# Patient Record
Sex: Male | Born: 1950 | Race: White | Hispanic: No | Marital: Married | Smoking: Never smoker
Health system: Southern US, Community
[De-identification: ages and names within clinical notes are randomized; demographics above are authoritative.]

## PROBLEM LIST (undated history)

## (undated) DIAGNOSIS — L121 Cicatricial pemphigoid: Secondary | ICD-10-CM

## (undated) DIAGNOSIS — I10 Essential (primary) hypertension: Secondary | ICD-10-CM

## (undated) HISTORY — PX: OTHER SURGICAL HISTORY: SHX169

---

## 2015-09-24 ENCOUNTER — Emergency Department (HOSPITAL_COMMUNITY)
Admission: EM | Admit: 2015-09-24 | Discharge: 2015-09-24 | Disposition: A | Discharge status: Home / Self Care | Payer: BLUE CROSS/BLUE SHIELD | Attending: Emergency Medicine | Admitting: Emergency Medicine

## 2015-09-24 ENCOUNTER — Encounter (HOSPITAL_COMMUNITY): Payer: Self-pay

## 2015-09-24 DIAGNOSIS — Z79899 Other long term (current) drug therapy: Secondary | ICD-10-CM | POA: Diagnosis not present

## 2015-09-24 DIAGNOSIS — I10 Essential (primary) hypertension: Secondary | ICD-10-CM | POA: Diagnosis not present

## 2015-09-24 DIAGNOSIS — R040 Epistaxis: Secondary | ICD-10-CM | POA: Insufficient documentation

## 2015-09-24 DIAGNOSIS — Z7982 Long term (current) use of aspirin: Secondary | ICD-10-CM | POA: Diagnosis not present

## 2015-09-24 HISTORY — DX: Essential (primary) hypertension: I10

## 2015-09-24 MED ORDER — TRANEXAMIC ACID 1000 MG/10ML IV SOLN
500.0000 mg | Freq: Once | INTRAVENOUS | Status: DC
  Filled 2015-09-24: qty 10

## 2015-09-24 MED ORDER — AMOXICILLIN-POT CLAVULANATE 500-125 MG PO TABS: 1.0000 | ORAL_TABLET | Freq: Three times a day (TID) | ORAL | 0 refills | Status: DC

## 2015-09-24 MED ORDER — ONDANSETRON 4 MG PO TBDP
4.0000 mg | ORAL_TABLET | Freq: Once | ORAL | Status: DC
  Filled 2015-09-24: qty 1

## 2015-09-24 MED ORDER — LIDOCAINE VISCOUS 2 % MT SOLN
15.0000 mL | Freq: Once | OROMUCOSAL | Status: DC
Start: 2015-09-24 — End: 2015-09-25
  Filled 2015-09-24: qty 15

## 2015-09-24 NOTE — ED Notes (Signed)
PA at bedside with patient

## 2015-09-24 NOTE — ED Provider Notes (Signed)
[facility] DEPT Provider Note   CSN: [id number] Arrival date & time: 09/24/15  1726     History   Chief Complaint Chief Complaint  Patient presents with  . Epistaxis    HPI Henry Ganderarl Fisher is a 65 y.o. male.  HPI   Patient has a PMH of adenoidectomy and nosebleeds as a young kid. He has not had any bleeds in his adult life.  He has a PMH of hypertension. He takes a daily baby aspirin. He reports nose bleed starting at 8 am this morning and has been continually bleeding. [facility] and sent to the ED for further management and treatment. He denies hitting his nose or having an injury. He denies experiencing any other areas of abnormal bleeding. The bleeding is coming through the right nare only. He has not been coughing or vomiting blood. No fevers, N/V/D, weakness, confusion or headaches. He otherwise reports feeling fine.    Past Medical History:  Diagnosis Date  . Hypertension     There are no active problems to display for this patient.  Past Surgical History:  Procedure Laterality Date  . kidney removed       Home Medications    Prior to Admission medications   Medication Sig Start Date End Date Taking? Authorizing Provider  aliskiren (TEKTURNA) 150 MG tablet Take 75 mg by mouth every evening.   Yes Historical Provider, MD  aspirin EC 81 MG tablet Take 81 mg by mouth every evening.   Yes Historical Provider, MD  chlorhexidine (PERIDEX) 0.12 % solution Use as directed 15 mLs in the mouth or throat 2 (two) times daily as needed (mouth leasions).   Yes Historical Provider, MD  dexamethasone (DECADRON) 0.5 MG/5ML solution Take 0.5 mg by mouth daily as needed (mouth leasions).   Yes Historical Provider, MD  amoxicillin-clavulanate (AUGMENTIN) 500-125 MG tablet Take 1 tablet (500 mg total) by mouth every 8 (eight) hours. 09/24/15   Marlon Peliffany Elzena Muston, PA-C    Family History History reviewed. No pertinent family history.  Social History Social History    Substance Use Topics  . Smoking status: Never Smoker  . Smokeless tobacco: Never Used  . Alcohol use Yes    Allergies   Aspirin  Review of Systems Review of Systems Review of Systems All other systems negative except as documented in the HPI. All pertinent positives and negatives as reviewed in the HPI.   Physical Exam Updated Vital Signs BP (!) 162/107   Pulse 76   Temp 97.5 F (36.4 C) (Oral)   Resp 14   SpO2 96%   Physical Exam  Constitutional: He is oriented to person, place, and time. He appears well-developed and well-nourished.  HENT:  Head: Normocephalic and atraumatic.  Nose: Epistaxis (right nare; unable to visualize specific siite of bleeding due to amount of blood) is observed.  Mouth/Throat: Mucous membranes are normal.  No blood in left nare  Eyes: EOM are normal. Pupils are equal, round, and reactive to light.  Neck: Normal range of motion.  Cardiovascular: Normal rate and regular rhythm.   Pulmonary/Chest: Effort normal and breath sounds normal.  Musculoskeletal: Normal range of motion.  Neurological: He is alert and oriented to person, place, and time.  Skin: Skin is warm and dry.     ED Treatments / Results  Labs (all labs ordered are listed, but only abnormal results are displayed) Labs Reviewed - No data to display  EKG  EKG Interpretation None  Radiology No results found.  Procedures Procedures (including critical care time)  Medications Ordered in ED Medications  lidocaine (XYLOCAINE) 2 % viscous mouth solution 15 mL (not administered)  tranexamic acid (CYKLOKAPRON) injection 500 mg (not administered)  ondansetron (ZOFRAN-ODT) disintegrating tablet 4 mg (not administered)     Initial Impression / Assessment and Plan / ED Course  I have reviewed the triage vital signs and the nursing notes.  Pertinent labs & imaging results that were available during my care of the patient were reviewed by me and considered in my medical  decision making (see chart for details).  Clinical Course    Patient is 65 yo, discussed case with Dr. Jacqulyn BathLong who is going to see patient and attempt to cauterize area if he is able to visualize source of bleeding. If unsuccessful we will need to place a [place].   -- We tried TXA first, this was unsuccessful there for packing was placed. Patient started on abx per attending, referral to ENT- pt is to call first thing in the morning to arrange f/u    Final Clinical Impressions(s) / ED Diagnoses   Final diagnoses:  Epistaxis    New Prescriptions New Prescriptions   AMOXICILLIN-CLAVULANATE (AUGMENTIN) 500-125 MG TABLET    Take 1 tablet (500 mg total) by mouth every 8 (eight) hours.     Marlon Peliffany Lissandro Dilorenzo, PA-C 09/24/15 2324    Maia PlanJoshua G Long, MD 09/25/15 [phone]

## 2015-09-24 NOTE — ED Notes (Signed)
Pt d/c home with family via w/c 

## 2015-09-24 NOTE — ED Triage Notes (Signed)
Pt states nose bleed ongoing since this morning. [facility] sent here for continued bleeding. Pt states passing of clots. Pt states hx of similar issues, but not in many years. Pt states on 81mg  ASA daily.

## 2015-10-25 ENCOUNTER — Encounter: Payer: Self-pay | Admitting: Nurse Practitioner

## 2015-10-25 ENCOUNTER — Ambulatory Visit (INDEPENDENT_AMBULATORY_CARE_PROVIDER_SITE_OTHER): Payer: BLUE CROSS/BLUE SHIELD | Admitting: Nurse Practitioner

## 2015-10-25 VITALS — BP 162/102 | HR 88 | Temp 98.4°F | Wt 229.0 lb

## 2015-10-25 DIAGNOSIS — Z0000 Encounter for general adult medical examination without abnormal findings: Secondary | ICD-10-CM

## 2015-10-25 DIAGNOSIS — Z23 Encounter for immunization: Secondary | ICD-10-CM | POA: Diagnosis not present

## 2015-10-25 DIAGNOSIS — I10 Essential (primary) hypertension: Secondary | ICD-10-CM | POA: Diagnosis not present

## 2015-10-25 DIAGNOSIS — Z905 Acquired absence of kidney: Secondary | ICD-10-CM | POA: Insufficient documentation

## 2015-10-25 MED ORDER — ALISKIREN FUMARATE 150 MG PO TABS: 150.0000 mg | ORAL_TABLET | Freq: Every evening | ORAL | 2 refills | Status: DC

## 2015-10-25 MED ORDER — ALISKIREN FUMARATE 150 MG PO TABS: 75.0000 mg | ORAL_TABLET | Freq: Every evening | ORAL | 2 refills | Status: DC

## 2015-10-25 NOTE — Progress Notes (Signed)
Subjective:    Patient ID: Henry Fisher, male    DOB: [birth year], 65 y.o.   MRN: [id number]  Patient presents today for complete physical or establish care (new patient) and HTN  Hypertension  Pertinent negatives include no chest pain, headaches, malaise/fatigue, palpitations or shortness of breath. Risk factors for coronary artery disease include obesity and male gender. Treatments tried: [place]. The current treatment provides significant improvement. There are no compliance problems.     HTN: Reports normal BP reading s at home but unable to tell me exact numbers.  Immunizations: (TDAP, Hep C screen, Pneumovax, Influenza, zoster)  Health Maintenance  Topic Date Due  .  Hepatitis C: One time screening is recommended by Center for Disease Control  (CDC) for  adults born from [birth year] through 1967.   06-06-1950  . Tetanus Vaccine  04/19/1967  . Colon Cancer Screening  04/19/1998  . Shingles Vaccine  04/18/2008  . Pneumonia vaccines (1 of 2 - PCV13) 04/18/2013  . Flu Shot  09/04/2015   Diet:healthy Weight:  Wt Readings from Last 3 Encounters:  10/25/15 229 lb (103.9 kg)    Exercise:bicycling for 1hr daily. Fall Risk: Fall Risk  10/25/2015  Falls in the past year? No   Depression/Suicide: Depression screen Henry Fisher 2/9 10/25/2015  Decreased Interest 0  Down, Depressed, Hopeless 0  PHQ - 2 Score 0   No flowsheet data found. Colonoscopy (every 5-69yrs, >50-29yrs):Up-to-date per patient, blood clots requested from patient. PSA (yearly, >66yrs): Needed Vision: Up-to-date per patient Dental: Up-to-date per patient Advanced Directive: Advanced Directives 09/24/2015  Does patient have an advance directive? Yes  Type of Advance Directive Living will  Does patient want to make changes to advanced directive? No - Patient declined  Copy of advanced directive(s) in chart? No - copy requested   Sexual History (birth control, marital status, STD): Divorced, not sexually active, has 1  son. To [place] from [place], 10 months ago.   Medications and allergies reviewed with patient and updated if appropriate.  Patient Active Problem List   Diagnosis Date Noted  . Essential hypertension, benign 10/25/2015  . 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 aspirin EC 81 MG tablet Take 81 mg by mouth every evening.     No current facility-administered medications on file prior to visit.     Past Medical History:  Diagnosis Date  . Hypertension     Past Surgical History:  Procedure Laterality Date  . kidney removed    . left nephrectomy     at age 69    Social History   Social History  . Marital status: Married    Spouse name: N/A  . Number of children: N/A  . Years of education: N/A   Social History Main Topics  . Smoking status: Never Smoker  . Smokeless tobacco: Never Used  . Alcohol use Yes  . Drug use: No  . Sexual activity: Not Asked   Other Topics Concern  . None   Social History Narrative  . None    Family History  Problem Relation Age of Onset  . Stroke Father         Review of Systems  Constitutional: Negative for fever, malaise/fatigue and weight loss.  HENT: Negative for congestion and sore throat.   Eyes:       Negative for visual changes  Respiratory: Negative for cough and shortness of breath.  Cardiovascular: Negative for chest pain, palpitations and leg swelling.  Gastrointestinal: Negative for blood in stool, constipation, diarrhea and heartburn.  Genitourinary: Negative for dysuria, frequency and urgency.  Musculoskeletal: Negative for falls, joint pain and myalgias.  Skin: Negative for rash.  Neurological: Negative for dizziness, sensory change and headaches.  Endo/Heme/Allergies: Does not bruise/bleed easily.  Psychiatric/Behavioral: Negative for depression, substance abuse and suicidal ideas. The patient is not  nervous/anxious.     Objective:   Vitals:   10/25/15 1402  BP: (!) 162/102  Pulse: 88  Temp: 98.4 F (36.9 C)    There is no height or weight on file to calculate BMI.   Physical Examination:  Physical Exam  Constitutional: He is oriented to person, place, and time and well-developed, well-nourished, and in no distress. No distress.  HENT:  Right Ear: External ear normal.  Left Ear: External ear normal.  Nose: Nose normal.  Mouth/Throat: Oropharynx is clear and moist. No oropharyngeal exudate.  Eyes: Conjunctivae and EOM are normal. Pupils are equal, round, and reactive to light. No scleral icterus.  Neck: Normal range of motion. Neck supple. No thyromegaly present.  Cardiovascular: Normal rate, regular rhythm, normal heart sounds and intact distal pulses.   Pulmonary/Chest: Effort normal and breath sounds normal.  Abdominal: Soft. Bowel sounds are normal. He exhibits no distension. There is no tenderness.  Genitourinary: Rectum normal.  Musculoskeletal: Normal range of motion. He exhibits no edema or tenderness.  Lymphadenopathy:    He has no cervical adenopathy.  Neurological: He is alert and oriented to person, place, and time. He has normal reflexes. No cranial nerve deficit. Gait normal.  Skin: Skin is warm and dry.  Psychiatric: Affect and judgment normal.  Vitals reviewed.   ASSESSMENT and PLAN:  Henry Fisher was seen today for hypertension.  Diagnoses and all orders for this visit:  Encounter for preventive health examination -     Comprehensive metabolic panel; Future -     TSH; Future -     Lipid Profile; Future -     CBC w/Diff; Future -     PSA; Future  Essential hypertension, benign -     Comprehensive metabolic panel; Future -     Discontinue: aliskiren (TEKTURNA) 150 MG tablet; Take 0.5 tablets (75 mg total) by mouth every evening. -     aliskiren (TEKTURNA) 150 MG tablet; Take 1 tablet (150 mg total) by mouth every evening.  Need for prophylactic  vaccination and inoculation against influenza -     Flu vaccine HIGH DOSE PF    No problem-specific Assessment & Plan notes found for this encounter.     Follow up: Return in about 3 months (around 01/24/2016).  Alysia Pennaharlotte Tannor Pyon, NP

## 2015-10-25 NOTE — Progress Notes (Signed)
Pre visit review using our clinic review tool, if applicable. No additional management support is needed unless otherwise documented below in the visit note. 

## 2015-10-29 ENCOUNTER — Telehealth: Payer: Self-pay | Admitting: Emergency Medicine

## 2015-10-29 NOTE — Telephone Encounter (Signed)
[employer] called and is still waiting on a PA or the prescription aliskiren (TEKTURNA) 150 MG tablet. Please fax it to [phone]. Thanks.

## 2015-11-09 NOTE — Telephone Encounter (Signed)
Per CoverMyMeds - [employer], medication is not covered. Pt must first try and fail 3 of the following; Lisinopril, Losartan, Valsartan, Irbesartan, and Enalapril. Please advise. Thanks!

## 2015-11-09 NOTE — Telephone Encounter (Signed)
I have not received any paperwork for prior authorization on this patient. He is a new patient to me and presented with no medical records. I advised him to get medical records as soon as possible and lab work, but patient has not done so until today. Please reach out to patient and inquire about his medical records. Also advised him to return to the lab in the basement for lab draw as ordered. I am unable to do a prior authorization at this time due to lack of medical information on this patient. Thank you

## 2015-11-12 NOTE — Telephone Encounter (Signed)
Contacted patient to advised of Charlotte's recommendation. Pt was upset that PA was not submitted to [place] in [place].  I attempted to inform patient that PA are submitted to insurance company and not previous/original prescriber but pt became irate and stated that it was "not worth the trouble" and he was "not coming back to this office anyway" and ended the phone call.

## 2016-01-21 ENCOUNTER — Ambulatory Visit: Payer: Medicare Other | Admitting: Nurse Practitioner

## 2016-05-21 ENCOUNTER — Other Ambulatory Visit: Payer: Self-pay | Admitting: Internal Medicine

## 2016-05-21 DIAGNOSIS — I10 Essential (primary) hypertension: Secondary | ICD-10-CM

## 2016-11-16 ENCOUNTER — Other Ambulatory Visit: Payer: Self-pay | Admitting: Internal Medicine

## 2016-11-16 DIAGNOSIS — I10 Essential (primary) hypertension: Secondary | ICD-10-CM

## 2016-11-17 NOTE — Telephone Encounter (Signed)
Please help call pt and offer an appt. Pt need to come in to see charlotte before we send this med.   Thanks.

## 2016-11-17 NOTE — Telephone Encounter (Signed)
LM letting pt know °

## 2017-05-27 DIAGNOSIS — M5417 Radiculopathy, lumbosacral region: Secondary | ICD-10-CM | POA: Diagnosis not present

## 2017-05-27 DIAGNOSIS — M545 Low back pain: Secondary | ICD-10-CM | POA: Diagnosis not present

## 2017-05-27 DIAGNOSIS — M48061 Spinal stenosis, lumbar region without neurogenic claudication: Secondary | ICD-10-CM | POA: Diagnosis not present

## 2017-06-09 DIAGNOSIS — J0140 Acute pansinusitis, unspecified: Secondary | ICD-10-CM | POA: Diagnosis not present

## 2017-06-09 DIAGNOSIS — J309 Allergic rhinitis, unspecified: Secondary | ICD-10-CM | POA: Diagnosis not present

## 2018-01-08 DIAGNOSIS — M1310 Monoarthritis, not elsewhere classified, unspecified site: Secondary | ICD-10-CM | POA: Diagnosis not present

## 2018-06-30 DIAGNOSIS — I10 Essential (primary) hypertension: Secondary | ICD-10-CM | POA: Diagnosis not present

## 2018-07-13 DIAGNOSIS — Z0000 Encounter for general adult medical examination without abnormal findings: Secondary | ICD-10-CM | POA: Diagnosis not present

## 2018-07-13 DIAGNOSIS — E559 Vitamin D deficiency, unspecified: Secondary | ICD-10-CM | POA: Diagnosis not present

## 2018-07-13 DIAGNOSIS — I10 Essential (primary) hypertension: Secondary | ICD-10-CM | POA: Diagnosis not present

## 2018-07-13 DIAGNOSIS — Z125 Encounter for screening for malignant neoplasm of prostate: Secondary | ICD-10-CM | POA: Diagnosis not present

## 2018-07-13 DIAGNOSIS — R5383 Other fatigue: Secondary | ICD-10-CM | POA: Diagnosis not present

## 2018-07-13 DIAGNOSIS — M79672 Pain in left foot: Secondary | ICD-10-CM | POA: Diagnosis not present

## 2018-07-13 DIAGNOSIS — R0602 Shortness of breath: Secondary | ICD-10-CM | POA: Diagnosis not present

## 2018-07-20 DIAGNOSIS — E559 Vitamin D deficiency, unspecified: Secondary | ICD-10-CM | POA: Diagnosis not present

## 2018-07-20 DIAGNOSIS — M79672 Pain in left foot: Secondary | ICD-10-CM | POA: Diagnosis not present

## 2018-07-20 DIAGNOSIS — E789 Disorder of lipoprotein metabolism, unspecified: Secondary | ICD-10-CM | POA: Diagnosis not present

## 2018-07-20 DIAGNOSIS — Z1339 Encounter for screening examination for other mental health and behavioral disorders: Secondary | ICD-10-CM | POA: Diagnosis not present

## 2018-07-20 DIAGNOSIS — I10 Essential (primary) hypertension: Secondary | ICD-10-CM | POA: Diagnosis not present

## 2018-07-23 DIAGNOSIS — N183 Chronic kidney disease, stage 3 (moderate): Secondary | ICD-10-CM | POA: Diagnosis not present

## 2018-07-28 DIAGNOSIS — H903 Sensorineural hearing loss, bilateral: Secondary | ICD-10-CM | POA: Diagnosis not present

## 2018-07-28 DIAGNOSIS — H6121 Impacted cerumen, right ear: Secondary | ICD-10-CM | POA: Diagnosis not present

## 2018-08-19 DIAGNOSIS — E559 Vitamin D deficiency, unspecified: Secondary | ICD-10-CM | POA: Diagnosis not present

## 2018-08-19 DIAGNOSIS — E789 Disorder of lipoprotein metabolism, unspecified: Secondary | ICD-10-CM | POA: Diagnosis not present

## 2018-08-19 DIAGNOSIS — Z1159 Encounter for screening for other viral diseases: Secondary | ICD-10-CM | POA: Diagnosis not present

## 2018-08-19 DIAGNOSIS — I10 Essential (primary) hypertension: Secondary | ICD-10-CM | POA: Diagnosis not present

## 2018-09-06 ENCOUNTER — Other Ambulatory Visit: Payer: Self-pay

## 2019-02-15 DIAGNOSIS — E789 Disorder of lipoprotein metabolism, unspecified: Secondary | ICD-10-CM | POA: Diagnosis not present

## 2019-02-15 DIAGNOSIS — Z1331 Encounter for screening for depression: Secondary | ICD-10-CM | POA: Diagnosis not present

## 2019-02-15 DIAGNOSIS — Z1339 Encounter for screening examination for other mental health and behavioral disorders: Secondary | ICD-10-CM | POA: Diagnosis not present

## 2019-02-15 DIAGNOSIS — E559 Vitamin D deficiency, unspecified: Secondary | ICD-10-CM | POA: Diagnosis not present

## 2019-02-15 DIAGNOSIS — Z1159 Encounter for screening for other viral diseases: Secondary | ICD-10-CM | POA: Diagnosis not present

## 2019-02-15 DIAGNOSIS — I10 Essential (primary) hypertension: Secondary | ICD-10-CM | POA: Diagnosis not present

## 2019-02-15 DIAGNOSIS — H9193 Unspecified hearing loss, bilateral: Secondary | ICD-10-CM | POA: Diagnosis not present

## 2019-03-06 ENCOUNTER — Ambulatory Visit: Payer: Medicare Other

## 2019-03-13 ENCOUNTER — Ambulatory Visit: Payer: Medicare Other

## 2019-03-27 ENCOUNTER — Ambulatory Visit: Payer: Medicare Other

## 2019-05-16 DIAGNOSIS — E789 Disorder of lipoprotein metabolism, unspecified: Secondary | ICD-10-CM | POA: Diagnosis not present

## 2019-05-16 DIAGNOSIS — I10 Essential (primary) hypertension: Secondary | ICD-10-CM | POA: Diagnosis not present

## 2019-05-16 DIAGNOSIS — Z1159 Encounter for screening for other viral diseases: Secondary | ICD-10-CM | POA: Diagnosis not present

## 2019-05-16 DIAGNOSIS — E559 Vitamin D deficiency, unspecified: Secondary | ICD-10-CM | POA: Diagnosis not present

## 2020-09-13 ENCOUNTER — Inpatient Hospital Stay (HOSPITAL_BASED_OUTPATIENT_CLINIC_OR_DEPARTMENT_OTHER)
Admission: EM | Admit: 2020-09-13 | Discharge: 2020-09-15 | DRG: 061 | Disposition: A | Discharge status: Home / Self Care | Payer: PPO | Attending: Internal Medicine | Admitting: Internal Medicine

## 2020-09-13 ENCOUNTER — Inpatient Hospital Stay (HOSPITAL_COMMUNITY): Payer: PPO

## 2020-09-13 ENCOUNTER — Encounter (HOSPITAL_BASED_OUTPATIENT_CLINIC_OR_DEPARTMENT_OTHER): Payer: Self-pay | Admitting: Emergency Medicine

## 2020-09-13 ENCOUNTER — Emergency Department (HOSPITAL_BASED_OUTPATIENT_CLINIC_OR_DEPARTMENT_OTHER): Payer: PPO

## 2020-09-13 ENCOUNTER — Other Ambulatory Visit: Payer: Self-pay

## 2020-09-13 DIAGNOSIS — E7800 Pure hypercholesterolemia, unspecified: Secondary | ICD-10-CM | POA: Diagnosis not present

## 2020-09-13 DIAGNOSIS — I6522 Occlusion and stenosis of left carotid artery: Secondary | ICD-10-CM | POA: Diagnosis not present

## 2020-09-13 DIAGNOSIS — I6381 Other cerebral infarction due to occlusion or stenosis of small artery: Principal | ICD-10-CM | POA: Diagnosis present

## 2020-09-13 DIAGNOSIS — I619 Nontraumatic intracerebral hemorrhage, unspecified: Secondary | ICD-10-CM | POA: Diagnosis not present

## 2020-09-13 DIAGNOSIS — G319 Degenerative disease of nervous system, unspecified: Secondary | ICD-10-CM | POA: Diagnosis not present

## 2020-09-13 DIAGNOSIS — R519 Headache, unspecified: Secondary | ICD-10-CM | POA: Diagnosis not present

## 2020-09-13 DIAGNOSIS — Z7982 Long term (current) use of aspirin: Secondary | ICD-10-CM | POA: Diagnosis not present

## 2020-09-13 DIAGNOSIS — I63532 Cerebral infarction due to unspecified occlusion or stenosis of left posterior cerebral artery: Secondary | ICD-10-CM | POA: Diagnosis not present

## 2020-09-13 DIAGNOSIS — Z823 Family history of stroke: Secondary | ICD-10-CM | POA: Diagnosis not present

## 2020-09-13 DIAGNOSIS — R29702 NIHSS score 2: Secondary | ICD-10-CM | POA: Diagnosis not present

## 2020-09-13 DIAGNOSIS — I639 Cerebral infarction, unspecified: Secondary | ICD-10-CM | POA: Diagnosis not present

## 2020-09-13 DIAGNOSIS — R479 Unspecified speech disturbances: Secondary | ICD-10-CM | POA: Diagnosis not present

## 2020-09-13 DIAGNOSIS — I6389 Other cerebral infarction: Secondary | ICD-10-CM | POA: Diagnosis not present

## 2020-09-13 DIAGNOSIS — R29700 NIHSS score 0: Secondary | ICD-10-CM | POA: Diagnosis not present

## 2020-09-13 DIAGNOSIS — R29704 NIHSS score 4: Secondary | ICD-10-CM | POA: Diagnosis not present

## 2020-09-13 DIAGNOSIS — Z20822 Contact with and (suspected) exposure to covid-19: Secondary | ICD-10-CM | POA: Diagnosis not present

## 2020-09-13 DIAGNOSIS — R4701 Aphasia: Secondary | ICD-10-CM | POA: Diagnosis present

## 2020-09-13 DIAGNOSIS — R29818 Other symptoms and signs involving the nervous system: Secondary | ICD-10-CM | POA: Diagnosis not present

## 2020-09-13 DIAGNOSIS — Z79899 Other long term (current) drug therapy: Secondary | ICD-10-CM

## 2020-09-13 DIAGNOSIS — R29701 NIHSS score 1: Secondary | ICD-10-CM | POA: Diagnosis not present

## 2020-09-13 DIAGNOSIS — I10 Essential (primary) hypertension: Secondary | ICD-10-CM | POA: Diagnosis present

## 2020-09-13 DIAGNOSIS — I6602 Occlusion and stenosis of left middle cerebral artery: Secondary | ICD-10-CM | POA: Diagnosis not present

## 2020-09-13 DIAGNOSIS — R29703 NIHSS score 3: Secondary | ICD-10-CM | POA: Diagnosis present

## 2020-09-13 DIAGNOSIS — Z905 Acquired absence of kidney: Secondary | ICD-10-CM

## 2020-09-13 HISTORY — DX: Cicatricial pemphigoid: L12.1

## 2020-09-13 LAB — CBC
HCT: 50.6 % (ref 39.0–52.0)
Hemoglobin: 17.5 g/dL — ABNORMAL HIGH (ref 13.0–17.0)
MCH: 31.1 pg (ref 26.0–34.0)
MCHC: 34.6 g/dL (ref 30.0–36.0)
MCV: 89.9 fL (ref 80.0–100.0)
Platelets: 264 10*3/uL (ref 150–400)
RBC: 5.63 MIL/uL (ref 4.22–5.81)
RDW: 11.7 % (ref 11.5–15.5)
WBC: 7.9 10*3/uL (ref 4.0–10.5)
nRBC: 0 % (ref 0.0–0.2)

## 2020-09-13 LAB — DIFFERENTIAL
Abs Immature Granulocytes: 0.02 10*3/uL (ref 0.00–0.07)
Basophils Absolute: 0.1 10*3/uL (ref 0.0–0.1)
Basophils Relative: 1 %
Eosinophils Absolute: 0.3 10*3/uL (ref 0.0–0.5)
Eosinophils Relative: 4 %
Immature Granulocytes: 0 %
Lymphocytes Relative: 39 %
Lymphs Abs: 3.1 10*3/uL (ref 0.7–4.0)
Monocytes Absolute: 0.7 10*3/uL (ref 0.1–1.0)
Monocytes Relative: 9 %
Neutro Abs: 3.7 10*3/uL (ref 1.7–7.7)
Neutrophils Relative %: 47 %

## 2020-09-13 LAB — COMPREHENSIVE METABOLIC PANEL
ALT: 32 U/L (ref 0–44)
AST: 28 U/L (ref 15–41)
Albumin: 4.5 g/dL (ref 3.5–5.0)
Alkaline Phosphatase: 64 U/L (ref 38–126)
Anion gap: 9 (ref 5–15)
BUN: 14 mg/dL (ref 8–23)
CO2: 27 mmol/L (ref 22–32)
Calcium: 10.4 mg/dL — ABNORMAL HIGH (ref 8.9–10.3)
Chloride: 100 mmol/L (ref 98–111)
Creatinine, Ser: 1.02 mg/dL (ref 0.61–1.24)
GFR, Estimated: >60 mL/min (ref >60)
Glucose, Bld: 126 mg/dL — ABNORMAL HIGH (ref 70–99)
Potassium: 4.1 mmol/L (ref 3.5–5.1)
Sodium: 136 mmol/L (ref 135–145)
Total Bilirubin: 0.7 mg/dL (ref 0.3–1.2)
Total Protein: 8 g/dL (ref 6.5–8.1)

## 2020-09-13 LAB — URINALYSIS, ROUTINE W REFLEX MICROSCOPIC
Bilirubin Urine: NEGATIVE
Glucose, UA: NEGATIVE mg/dL
Hgb urine dipstick: NEGATIVE
Ketones, ur: NEGATIVE mg/dL
Leukocytes,Ua: NEGATIVE
Nitrite: NEGATIVE
Protein, ur: NEGATIVE mg/dL
Specific Gravity, Urine: 1.022 (ref 1.005–1.030)
pH: 7 (ref 5.0–8.0)

## 2020-09-13 LAB — RAPID URINE DRUG SCREEN, HOSP PERFORMED
Amphetamines: NOT DETECTED
Barbiturates: NOT DETECTED
Benzodiazepines: NOT DETECTED
Cocaine: NOT DETECTED
Opiates: NOT DETECTED
Tetrahydrocannabinol: POSITIVE — AB

## 2020-09-13 LAB — RESP PANEL BY RT-PCR (FLU A&B, COVID) ARPGX2
Influenza A by PCR: NEGATIVE
Influenza B by PCR: NEGATIVE
SARS Coronavirus 2 by RT PCR: NEGATIVE

## 2020-09-13 LAB — MRSA NEXT GEN BY PCR, NASAL: MRSA by PCR Next Gen: NOT DETECTED

## 2020-09-13 LAB — ETHANOL: Alcohol, Ethyl (B): 11 mg/dL — ABNORMAL HIGH (ref <10)

## 2020-09-13 LAB — APTT: aPTT: 27 seconds (ref 24–36)

## 2020-09-13 LAB — CBG MONITORING, ED: Glucose-Capillary: 137 mg/dL — ABNORMAL HIGH (ref 70–99)

## 2020-09-13 LAB — PROTIME-INR
INR: 1 (ref 0.8–1.2)
Prothrombin Time: 12.8 seconds (ref 11.4–15.2)

## 2020-09-13 MED ORDER — PANTOPRAZOLE SODIUM 40 MG IV SOLR
40.0000 mg | Freq: Every day | INTRAVENOUS | Status: DC
  Administered 2020-09-13: 40 mg via INTRAVENOUS
  Filled 2020-09-13: qty 40

## 2020-09-13 MED ORDER — ACETAMINOPHEN 650 MG RE SUPP: 650.0000 mg | RECTAL | Status: DC | PRN

## 2020-09-13 MED ORDER — ACETAMINOPHEN 325 MG PO TABS: 650.0000 mg | ORAL_TABLET | ORAL | Status: DC | PRN

## 2020-09-13 MED ORDER — ACETAMINOPHEN 160 MG/5ML PO SOLN: 650.0000 mg | ORAL | Status: DC | PRN

## 2020-09-13 MED ORDER — SODIUM CHLORIDE 0.9 % IV SOLN
50.0000 mL | Freq: Once | INTRAVENOUS | Status: AC
  Administered 2020-09-13: 50 mL via INTRAVENOUS

## 2020-09-13 MED ORDER — ONDANSETRON HCL 4 MG/2ML IJ SOLN: 4.0000 mg | Freq: Once | INTRAMUSCULAR | Status: AC

## 2020-09-13 MED ORDER — ALTEPLASE (STROKE) FULL DOSE INFUSION
0.9000 mg/kg | Freq: Once | INTRAVENOUS | Status: AC
  Filled 2020-09-13: qty 100

## 2020-09-13 MED ORDER — CHLORHEXIDINE GLUCONATE CLOTH 2 % EX PADS
6.0000 | MEDICATED_PAD | Freq: Every day | CUTANEOUS | Status: DC
  Administered 2020-09-15: 6 via TOPICAL

## 2020-09-13 MED ORDER — STROKE: EARLY STAGES OF RECOVERY BOOK: Freq: Once | Status: AC

## 2020-09-13 MED ORDER — ONDANSETRON HCL 4 MG/2ML IJ SOLN
4.0000 mg | Freq: Once | INTRAMUSCULAR | Status: AC
  Administered 2020-09-13: 4 mg via INTRAVENOUS
  Filled 2020-09-13: qty 2

## 2020-09-13 MED ORDER — CLEVIDIPINE BUTYRATE 0.5 MG/ML IV EMUL
0.0000 mg/h | INTRAVENOUS | Status: DC
  Administered 2020-09-13: 1 mg/h via INTRAVENOUS
  Filled 2020-09-13: qty 100

## 2020-09-13 MED ORDER — SENNOSIDES-DOCUSATE SODIUM 8.6-50 MG PO TABS: 1.0000 | ORAL_TABLET | Freq: Every evening | ORAL | Status: DC | PRN

## 2020-09-13 MED ORDER — ALTEPLASE 100 MG IV SOLR
INTRAVENOUS | Status: AC
  Administered 2020-09-13: 88.4 mg via INTRAVENOUS
  Filled 2020-09-13: qty 100

## 2020-09-13 MED ORDER — LABETALOL HCL 5 MG/ML IV SOLN
INTRAVENOUS | Status: AC
  Administered 2020-09-13: 10 mg via INTRAVENOUS
  Filled 2020-09-13: qty 4

## 2020-09-13 MED ORDER — SODIUM CHLORIDE 0.9 % IV SOLN: INTRAVENOUS | Status: DC

## 2020-09-13 MED ORDER — ONDANSETRON HCL 4 MG/2ML IJ SOLN
INTRAMUSCULAR | Status: AC
  Administered 2020-09-13: 4 mg via INTRAVENOUS
  Filled 2020-09-13: qty 2

## 2020-09-13 MED ORDER — LABETALOL HCL 5 MG/ML IV SOLN
10.0000 mg | Freq: Once | INTRAVENOUS | Status: AC
  Administered 2020-09-13: 10 mg via INTRAVENOUS

## 2020-09-13 MED ORDER — IOHEXOL 350 MG/ML SOLN
75.0000 mL | Freq: Once | INTRAVENOUS | Status: AC | PRN
  Administered 2020-09-13: 75 mL via INTRAVENOUS

## 2020-09-13 MED ORDER — LABETALOL HCL 5 MG/ML IV SOLN: 10.0000 mg | Freq: Once | INTRAVENOUS | Status: AC

## 2020-09-13 NOTE — Progress Notes (Signed)
Same day progress note  Patient's CT head was followed. There is a small area of hyperdensity in the left thalamic capsular area which appears that it was somewhat present in the CT scan that was done prior to tPA administration and which was more of the brain density but now appears a little brighter.  Not a very big area but could represent a bleed.  I at this point I am reluctant to reverse his tPA and make him prothrombotic while he is actively having a stroke but if the bleed was big, I would reverse him.  Given the small size of the bleed, I would watch him closely and obtain a CT of the head in 3 hours at 10 PM. I have signed out his care to my oncoming partner who will follow up the CT. If the bleed size looks like it has increased at that time, tPA reversal should be considered at that time.  I have discussed my plan with my oncoming partner as well as the patient and his family.   -- Milon Dikes, MD Neurologist [facility] Pager: [phone]

## 2020-09-13 NOTE — ED Notes (Signed)
Nausea has subsided.

## 2020-09-13 NOTE — ED Notes (Signed)
[facility] present to transport pt to [facility] 

## 2020-09-13 NOTE — H&P (Signed)
Stroke neurology admission history and physical CC: Aphasia  History is obtained from: Chart, patient's family at bedside  HPI: Henry Fisher is a 70 y.o. male past history of hypertension, presenting with acute onset of speech deficit-last known well relayed to the telemedicine neurologist 12:30 PM-but to me he says more like 1 or 1:30 PM-had sudden onset of difficulty talking after a sudden onset of a headache.  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  He had trouble understanding as well as trouble making words.  His wife drove him to the emergency department.  He vomited once in the waiting room and was continuing to complain of headache when he was evaluated there.  His blood pressure was 189/121.  Code stroke was called, he was evaluated by telemedicine neurology, no absolute contraindication to tPA and disabling aphasia symptoms prompted tPA administration. Cleviprex was required to control his blood pressures-continues to be on Cleviprex  Upon arrival at [facility], continues to complain of headache and difficulty with speech.  Also complaining of nausea and vomiting intermittently.   LKW: Documented before tPA administration 12:30 PM-might have been somewhat later as well. tpa given?: no, yes-by telemedicine neurology Premorbid modified Rankin scale (mRS): 0  ROS: Full ROS was performed and is negative except as noted in the HPI.  Past Medical History:  Diagnosis Date   Hypertension    Pemphigoid, benign mucosal         Family History  Problem Relation Age of Onset   Stroke Father      Social History:   reports that he has never smoked. He has never used smokeless tobacco. He reports current alcohol use. He reports that he does not use drugs.  Medications  Current Facility-Administered Medications:     stroke: mapping our early stages of  recovery book, , Does not apply, Once, Milon Dikes, MD   0.9 %  sodium chloride infusion, , Intravenous, Continuous, Milon Dikes, MD   acetaminophen (TYLENOL) tablet 650 mg, 650 mg, Oral, Q4H PRN **OR** acetaminophen (TYLENOL) 160 MG/5ML solution 650 mg, 650 mg, Per Tube, Q4H PRN **OR** acetaminophen (TYLENOL) suppository 650 mg, 650 mg, Rectal, Q4H PRN, Milon Dikes, MD   Melene Muller ON 09/14/2020] Chlorhexidine Gluconate Cloth 2 % PADS 6 each, 6 each, Topical, Q0600, Caryl Pina, MD   clevidipine (CLEVIPREX) infusion 0.5 mg/mL, 0-21 mg/hr, Intravenous, Continuous, Pricilla Loveless, MD, Last Rate: 8 mL/hr at 09/13/20 1800, 4 mg/hr at 09/13/20 1800   pantoprazole (PROTONIX) injection 40 mg, 40 mg, Intravenous, QHS, Milon Dikes, MD   senna-docusate (Senokot-S) tablet 1 tablet, 1 tablet, Oral, QHS PRN, Milon Dikes, MD   Exam: Current vital signs: BP (!) 151/85   Pulse 82   Temp 97.8 F (36.6 C) (Oral)   Resp 12   Wt 97.5 kg   SpO2 96%  Vital signs in last 24 hours: Temp:  [97.6 F (36.4 C)-97.8 F (36.6 C)] 97.8 F (36.6 C) (08/11 1726) Pulse Rate:  [75-88] 82 (08/11 1800) Resp:  [12-23] 12 (08/11 1800) BP: (107-200)/(74-123) 151/85 (08/11 1800) SpO2:  [91 %-97 %] 96 % (08/11 1800) Weight:  [97.5 kg-98.2 kg] 97.5 kg (08/11 1726) GENERAL: Awake, alert in NAD HEENT: - Normocephalic and atraumatic, dry mm, no LN++, no Thyromegally LUNGS - Clear to auscultation bilaterally with no wheezes CV - S1S2 RRR, no m/r/g, equal pulses bilaterally. ABDOMEN -  Soft, nontender, nondistended with normoactive BS Ext: warm, well perfused, intact peripheral pulses, no edema  NEURO:  Mental Status: Awake alert oriented to self, fact that he is in the hospital. Naming, comprehension, fluency and repetition are all impaired. Cranial Nerves: PERRL. EOMI, visual fields full, no facial asymmetry, facial sensation intact, hearing intact, tongue/uvula/soft palate midline, normal sternocleidomastoid and  trapezius muscle strength. No evidence of tongue atrophy or fibrillations Motor: 5/5 in all fours without drift Tone: is normal and bulk is normal Sensation- Intact to light touch bilaterally Coordination: FTN intact bilaterally, no ataxia in BLE. Gait- deferred  NIHSS-1 for aphasia   Labs I have reviewed labs in epic and the results pertinent to this consultation are:  CBC    Component Value Date/Time   WBC 7.9 09/13/2020 1500   RBC 5.63 09/13/2020 1500   HGB 17.5 (H) 09/13/2020 1500   HCT 50.6 09/13/2020 1500   PLT 264 09/13/2020 1500   MCV 89.9 09/13/2020 1500   MCH 31.1 09/13/2020 1500   MCHC 34.6 09/13/2020 1500   RDW 11.7 09/13/2020 1500   LYMPHSABS 3.1 09/13/2020 1500   MONOABS 0.7 09/13/2020 1500   EOSABS 0.3 09/13/2020 1500   BASOSABS 0.1 09/13/2020 1500    CMP     Component Value Date/Time   NA 136 09/13/2020 1500   K 4.1 09/13/2020 1500   CL 100 09/13/2020 1500   CO2 27 09/13/2020 1500   GLUCOSE 126 (H) 09/13/2020 1500   BUN 14 09/13/2020 1500   CREATININE 1.02 09/13/2020 1500   CALCIUM 10.4 (H) 09/13/2020 1500   PROT 8.0 09/13/2020 1500   ALBUMIN 4.5 09/13/2020 1500   AST 28 09/13/2020 1500   ALT 32 09/13/2020 1500   ALKPHOS 64 09/13/2020 1500   BILITOT 0.7 09/13/2020 1500   GFRNONAA >60 09/13/2020 1500   Imaging I have reviewed the images obtained:  CT-head-no bleed.  Aspects 10 CT head and neck-no LVO  Assessment:  70 year old man with past medical history of hypertension presenting with sudden onset of aphasia on examination has expressive and receptive aphasia concerning for left hemispheric stroke. No evidence of large vessel occlusion on CT angiography Etiology of the stroke-?  Embolic Complains of headache after tPA-will need stat CT head to rule out a bleed.   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 Frequent neurochecks as per the protocol 2D echo A1c Lipid panel PT OT Speech therapy No antiplatelets or anticoagulants at least for 24 hours from IV tPA. 24-hour imaging in the form of MRI or CT MRI brain without contrast Stat CT head for complaints of nausea and vomiting  Plan discussed with the patient's wife and daughter at bedside.  Stroke team will continue to follow.   -- Milon Dikes, MD Neurologist [facility] Pager: [phone]  CRITICAL CARE ATTESTATION Performed by: Milon Dikes, MD Total critical care time: 40 minutes Critical care time was exclusive of separately billable procedures and treating other patients and/or supervising APPs/Residents/Students Critical care was necessary to treat or prevent imminent or life-threatening deterioration due to acute ischemic stroke status post thrombolysis This patient is critically ill and at significant risk for neurological worsening and/or death and care requires constant monitoring. Critical care was time spent personally by me on the following activities: development of treatment plan with patient and/or surrogate as well as  nursing, discussions with consultants, evaluation of patient's response to treatment, examination of patient, obtaining history from patient or surrogate, ordering and performing treatments and interventions, ordering and review of laboratory studies, ordering and review of radiographic studies, pulse oximetry, re-evaluation of patient's condition, participation in multidisciplinary rounds and medical decision making of high complexity in the care of this patient.

## 2020-09-13 NOTE — ED Notes (Signed)
Patient transported to CT 

## 2020-09-13 NOTE — ED Notes (Signed)
Times: Code Stroke called 1453 Dr Georg Ruddle joined at 1507 TPA started at 1546 Labetolol at 1540 and 1544 20mg  each push. Cleviprex started at [facility] notified twice due to difference in room change

## 2020-09-13 NOTE — ED Provider Notes (Signed)
[facility] EMERGENCY DEPT Provider Note   CSN: [id number] Arrival date & time: 09/13/20  1439  LEVEL 5 CAVEAT - ALTERED MENTAL STATUS   History Chief Complaint  Patient presents with   Altered Mental Status   Emesis   Nausea    Henry Fisher is a 70 y.o. male.  HPI 71 year old male presents with acute headache and altered mental status.  History is from wife and patient.  Patient did not feel quite well yesterday but then when he woke up this morning he was normal.  Wife states elastoma 12:30 PM.  He works from home on a computer and a little after 1 PM he started to have headache and confusion.  Some slurred speech but he is also seeming disoriented.  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  He is not on blood thinners or aspirin's.  Has a history of hypertension.  Started having vomiting when he arrived to the emergency department.  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 Authorizing Provider  aliskiren (TEKTURNA) 150 MG tablet Take 1 tablet (150 mg total) by mouth every evening. Overdue for follow-up appt must have appt for future refills 05/21/16   Nche, Bonna Gains, NP  aspirin EC 81 MG tablet Take 81 mg by mouth every evening.    [provider]    Allergies    Aspirin  Review of Systems   Review of Systems  Unable to perform ROS: Mental status change   Physical  Exam Updated Vital Signs BP (!) 200/117   Pulse 83   Temp 97.6 F (36.4 C) (Oral)   Resp 19   Wt 98.2 kg   SpO2 95%   Physical Exam Vitals and nursing note reviewed.  Constitutional:      Appearance: He is well-developed.  HENT:     Head: Normocephalic and atraumatic.     Right Ear: External ear normal.     Left Ear: External ear normal.     Nose: Nose normal.  Eyes:     General:        Right eye: No discharge.        Left eye: No discharge.     Extraocular Movements: Extraocular movements intact.     Pupils: Pupils are equal, round, and reactive to light.  Cardiovascular:     Rate and Rhythm: Normal rate and regular rhythm.     Heart sounds: Normal heart sounds.  Pulmonary:     Effort: Pulmonary effort is normal.     Breath sounds: Normal breath sounds.  Abdominal:     Palpations: Abdomen is soft.     Tenderness: There is no abdominal tenderness.  Musculoskeletal:     Cervical back: Neck supple.  Skin:    General: Skin is warm and  dry.  Neurological:     Mental Status: He is alert.     Comments: Patient is awake and alert and oriented to person, place, day of week and year.  He is able to name pen and the thumb but he cannot find a word for a watch.  Frequently seems frustrated with not being able to get the right word out. He has no facial droop.  Normal extraocular movements.  5/5 strength in all 4 extremities.  Psychiatric:        Mood and Affect: Mood is not anxious.    ED Results / Procedures / Treatments   Labs (all labs ordered are listed, but only abnormal results are displayed) Labs Reviewed  CBC - Abnormal; Notable for the following components:      Result Value   Hemoglobin 17.5 (*)    All other components within normal limits  COMPREHENSIVE METABOLIC PANEL - Abnormal; Notable for the following components:   Glucose, Bld 126 (*)    Calcium 10.4 (*)    All other components within normal limits  CBG MONITORING, ED - Abnormal; Notable for the  following components:   Glucose-Capillary 137 (*)    All other components within normal limits  RESP PANEL BY RT-PCR (FLU A&B, COVID) ARPGX2  PROTIME-INR  APTT  DIFFERENTIAL  ETHANOL  RAPID URINE DRUG SCREEN, HOSP PERFORMED  URINALYSIS, ROUTINE W REFLEX MICROSCOPIC    EKG None  Radiology CT HEAD CODE STROKE WO CONTRAST  Result Date: 09/13/2020 CLINICAL DATA:  Code stroke. EXAM: CT HEAD WITHOUT CONTRAST TECHNIQUE: Contiguous axial images were obtained from the base of the skull through the vertex without intravenous contrast. COMPARISON:  None. FINDINGS: Brain: There is no acute intracranial hemorrhage, mass effect, or edema. Gray-white differentiation is preserved. Prominence of the ventricles and sulci reflects generalized parenchymal volume loss. Patchy and confluent areas of hypoattenuation in the supratentorial white matter nonspecific but may reflect moderate to marked chronic microvascular ischemic changes. Vascular: No hyperdense vessel. Skull: Unremarkable. Sinuses/Orbits: No acute abnormality. Other: Mastoid air cells are clear. ASPECTS (Alberta Stroke Program Early CT Score) - Ganglionic level infarction (caudate, lentiform nuclei, internal capsule, insula, M1-M3 cortex): 7 - Supraganglionic infarction (M4-M6 cortex): 3 Total score (0-10 with 10 being normal): 10 IMPRESSION: There is no acute intracranial hemorrhage or evidence of acute infarction. ASPECT score is 10. Chronic microvascular ischemic changes. These results were called by telephone at the time of interpretation on 09/13/2020 at 3:13 pm to provider Pricilla Loveless , who verbally acknowledged these results. Electronically Signed   By: Guadlupe Spanish M.D.   On: 09/13/2020 15:17    Procedures .Critical Care  Date/Time: 09/13/2020 3:43 PM Performed by: Pricilla Loveless, MD Authorized by: Pricilla Loveless, MD   Critical care provider statement:    Critical care time (minutes):  40   Critical care time was exclusive of:   Separately billable procedures and treating other patients   Critical care was necessary to treat or prevent imminent or life-threatening deterioration of the following conditions:  CNS failure or compromise   Critical care was time spent personally by me on the following activities:  Discussions with consultants, evaluation of patient's response to treatment, examination of patient, ordering and performing treatments and interventions, ordering and review of laboratory studies, ordering and review of radiographic studies, pulse oximetry, re-evaluation of patient's condition, obtaining history from patient or surrogate and review of old charts   Medications Ordered in ED Medications  ondansetron (ZOFRAN) injection 4 mg (has no  administration in time range)  clevidipine (CLEVIPREX) infusion 0.5 mg/mL (2 mg/hr Intravenous Rate/Dose Change 09/13/20 1541)  alteplase (ACTIVASE) 1 mg/mL infusion SOLN 88.4 mg (has no administration in time range)    Followed by  0.9 %  sodium chloride infusion (has no administration in time range)  alteplase (ACTIVASE) 1 mg/mL injection (has no administration in time range)  labetalol (NORMODYNE) injection 10 mg (10 mg Intravenous Given 09/13/20 1540)    ED Course  I have reviewed the triage vital signs and the nursing notes.  Pertinent labs & imaging results that were available during my care of the patient were reviewed by me and considered in my medical decision making (see chart for details).  Clinical Course as of 09/13/20 1543  Thu Sep 13, 2020  1451 Will call code stroke given acute nature. However I'm concerned about head bleed as well with headache/vomiting. [SG]    Clinical Course User Index [SG] Pricilla Loveless, MD   MDM Rules/Calculators/A&P                           Patient presents with acute difficulty speaking/understanding with headache. Head CT is negative. Tele neuro consulted, recommending TPA, which wife agrees to. Is now more hypertensive,  so he was started on cleviprex and given labetalol. When BP is under 185/110 TPA  will be started. He is protecting his airway. He will get a CTA, and depending on these findings will go to [facility] for IR, or will need neuro ICU admission. Care to Dr. Donnald Garre with CTA pending.  Final Clinical Impression(s) / ED Diagnoses Final diagnoses:  Acute ischemic stroke [facility])    Rx / DC Orders ED Discharge Orders     None        Pricilla Loveless, MD 09/13/20 1547

## 2020-09-13 NOTE — ED Triage Notes (Signed)
Pt presents with Headache approx at 1 pm, while working. Last seen normal 1230. Mixing words up, slurred speech, headache. Vomiting. H/a  189/121

## 2020-09-13 NOTE — ED Notes (Signed)
Pt is talkative, at intervals has trouble forming his words. He had difficulty trying lick top and bottom lips.

## 2020-09-13 NOTE — Progress Notes (Signed)
Pharmacist Code Stroke Response  Notified to mix tPA at 1537 by Dr. Otelia Limes Delivered tPA to RN at 1541  tPA dose = 8.8mg  bolus over 1 minute followed by 79.6mg  for a total dose of 88.4mg  over 1 hour  Issues/delays encountered (if applicable): Blood pressure required treatment  [name], [name] 09/13/20 3:44 PM

## 2020-09-13 NOTE — Consult Note (Addendum)
[facility] TeleNeurology Consult Services    Date of Service:  09/13/2020    Metrics: Last Known Well: 1230 Symptoms: As per HPI.  Patient is a candidate for thrombolytic.   Location of the provider: [facility]  Location of the patient: [facility] Pre-Morbid Modified Rankin Scale: 0  This consult was provided via telemedicine with 2-way video and audio communication. The patient/family was informed that care would be provided in this way and agreed to receive care in this manner.   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 Naming severely impaired.  - CT head: There is no acute intracranial hemorrhage or evidence of acute infarction. ASPECT score is 10.   Chronic microvascular ischemic changes. - After comprehensive review of possible contraindications, he has no absolute contraindications to tPA administration. Patient is a tPA candidate. Discussed extensively the risks/benefits of tPA treatment vs. no treatment with the patient and his wife, including risks of hemorrhage and death with tPA administration versus worse overall outcomes on average in patients within tPA time window who are not administered tPA. The patient's aphasia precludes meaningful medical decision making on his part at this time. Overall benefits of tPA regarding long-term prognosis are felt to outweigh risks. The patient's wife expressed understanding and wish to proceed with tPA.     Recommendations: - Infusing tPA - STAT CTA of head and neck to rule out LVO.  - Calling [facility] to [facility]  - BP > 185 systolic and > 110 diastolic. Clevidipine gtt as well as 2 doses of 10 mg IV labetalol administered to  bring SBP below threshold prior to tPA administration. - Zofran 4 mg IV administered for N/V      ------------------------------------------------------------------------------   History of Present Illness: 70 year old male with a PMHx of HTN who presents with acute onset of speech deficit. 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 The presentation was apparently very stressful to the patient. He started to have a throbbing headache along the left side of his head and generally started to feel unwell. At 1:30 PM his wife noted that his "speech was mixed up". He had trouble understanding her and much of what he was saying did not make any sense. His wife then drove him to the ED. He vomited once while in the waiting room and was continuing to complain of a headache. His BP was 189/121. On exam by EDP, he was noted to have naming deficit and seemed "frustrated with not being able to get the right word out". A Code Stroke was called.    Home medications include ASA. He is taking aliskiren for BP control.    Past Medical History: HTN Remote history of migraines as a teenager   Past Surgical History: Left nephrectomy at age 60 (indication: kidney trauma)    Medications:  No current facility-administered medications on file prior to encounter.   Current Outpatient Medications on File Prior to Encounter  Medication Sig Dispense Refill   aliskiren (TEKTURNA) 150 MG tablet Take 1 tablet (150 mg total) by mouth every evening. Overdue for follow-up appt must have appt for future refills 30 tablet 0   aspirin EC 81 MG tablet Take 81 mg by mouth every evening.  Social History: Drinks EtOH No drug use Never smoker Former Copywriter, advertising   Family History:  Stroke   ROS: As per HPI    Anticoagulant use:  No   Antiplatelet use: ASA   Examination:    BP (!) 189/121 (BP Location: Right Arm)   Pulse 88   Temp 97.6 F (36.4 C) (Oral)   Resp  16   Wt 98.2 kg   SpO2 95%   Neurological exam reveals receptive aphasia with fluent word-salad quality of speech, phonemic paraphasias, semantic paraphasias and comprehension deficit. Naming severely impaired. No facial droop, limb weakness, limb numbness, ataxia or visual field cut noted.    1A: Level of Consciousness - 0 1B: Ask Month and Age - 2 1C: Blink Eyes & Squeeze Hands - 0 2: Test Horizontal Extraocular Movements - 0 3: Test Visual Fields - 0 4: Test Facial Palsy (Use Grimace if Obtunded) - 0 5A: Test Left Arm Motor Drift - 0 5B: Test Right Arm Motor Drift - 0 6A: Test Left Leg Motor Drift - 0 6B: Test Right Leg Motor Drift - 0 7: Test Limb Ataxia (FNF/Heel-Shin) - 0 8: Test Sensation -  0 9: Test Language/Aphasia - 1 10: Test Dysarthria - Severe Dysarthria: 0 11: Test Extinction/Inattention - Extinction to bilateral simultaneous stimulation 0   NIHSS Score: 3     Patient/Family was informed the Neurology Consult would occur via TeleHealth consult by way of interactive audio and video telecommunications and consented to receiving care in this manner.   Patient is being evaluated for possible acute neurologic impairment and high pretest probability of imminent or life-threatening deterioration. I spent a total of 59 minutes of critical care time in the evaluation of this patient, including time for face to face visit via telemedicine, review of medical records, imaging studies and discussion of findings with providers, the patient and/or family. The patient is classifiable as critically ill given acute stroke with possible life-threatening complications.   Addendum: -Continuing to monitor patient via Teleneurology -Increased headache to 8/10 without worsening of neurological deficit -Additional Zofran 4 mg IV administered  21 additional minutes of critical care time    Electronically signed: Dr. Caryl Pina

## 2020-09-13 NOTE — ED Notes (Signed)
To CT for CTA

## 2020-09-13 NOTE — ED Provider Notes (Signed)
Patient is already admitted to Dr. Otelia Limes service for CVA.  tPA has been initiated and Cleviprex for hypertension.  I have assessed the patient.  He is very nauseated but alert with clear mental status.  He has a left-sided temporal headache.  No respiratory distress.  Airway well protected. Physical Exam  BP (!) 153/91   Pulse 80   Temp 97.6 F (36.4 C) (Oral)   Resp 15   Wt 98.2 kg   SpO2 97%   Physical Exam Constitutional:      Comments: Alert speaking full sentences situationally oriented.  No respiratory distress.  Pulmonary:     Effort: Pulmonary effort is normal.  Neurological:     Comments: Patient spontaneously using both upper extremities.  Speech is situationally appropriate.    ED Course/Procedures   Clinical Course as of 09/13/20 1620  Thu Sep 13, 2020  1451 Will call code stroke given acute nature. However I'm concerned about head bleed as well with headache/vomiting. [SG]    Clinical Course User Index [SG] Pricilla Loveless, MD    Procedures  MDM  Patient care is underway for CVA.  We will continue to monitor prior to transfer for any sign of mental status change or airway issues.       Arby Barrette, MD 09/13/20 1622

## 2020-09-14 ENCOUNTER — Inpatient Hospital Stay (HOSPITAL_COMMUNITY): Payer: PPO

## 2020-09-14 DIAGNOSIS — I63532 Cerebral infarction due to unspecified occlusion or stenosis of left posterior cerebral artery: Secondary | ICD-10-CM

## 2020-09-14 DIAGNOSIS — E7800 Pure hypercholesterolemia, unspecified: Secondary | ICD-10-CM

## 2020-09-14 DIAGNOSIS — I639 Cerebral infarction, unspecified: Secondary | ICD-10-CM

## 2020-09-14 DIAGNOSIS — I6389 Other cerebral infarction: Secondary | ICD-10-CM

## 2020-09-14 LAB — LIPID PANEL
Cholesterol: 210 mg/dL — ABNORMAL HIGH (ref 0–200)
HDL: 37 mg/dL — ABNORMAL LOW (ref >40)
LDL Cholesterol: 149 mg/dL — ABNORMAL HIGH (ref 0–99)
Total CHOL/HDL Ratio: 5.7 RATIO
Triglycerides: 121 mg/dL (ref <150)
VLDL: 24 mg/dL (ref 0–40)

## 2020-09-14 LAB — ECHOCARDIOGRAM COMPLETE
AR max vel: 2.64 cm2
AV Area VTI: 2.7 cm2
AV Area mean vel: 2.45 cm2
AV Mean grad: 3 mmHg
AV Peak grad: 5 mmHg
Ao pk vel: 1.12 m/s
Area-P 1/2: 4.21 cm2
Calc EF: 49.8 %
S' Lateral: 2.8 cm
Single Plane A2C EF: 56.7 %
Single Plane A4C EF: 40 %
Weight: 3439.18 oz

## 2020-09-14 LAB — HEMOGLOBIN A1C
Hgb A1c MFr Bld: 6.7 % — ABNORMAL HIGH (ref 4.8–5.6)
Mean Plasma Glucose: 145.59 mg/dL

## 2020-09-14 MED ORDER — LABETALOL HCL 5 MG/ML IV SOLN: 5.0000 mg | INTRAVENOUS | Status: DC | PRN

## 2020-09-14 MED ORDER — PANTOPRAZOLE SODIUM 40 MG PO TBEC
40.0000 mg | DELAYED_RELEASE_TABLET | Freq: Every day | ORAL | Status: DC
  Administered 2020-09-14 – 2020-09-15 (×2): 40 mg via ORAL
  Filled 2020-09-14 (×2): qty 1

## 2020-09-14 MED ORDER — ASPIRIN EC 81 MG PO TBEC
81.0000 mg | DELAYED_RELEASE_TABLET | Freq: Every day | ORAL | Status: DC
  Administered 2020-09-15: 81 mg via ORAL
  Filled 2020-09-14 (×2): qty 1

## 2020-09-14 MED ORDER — ATORVASTATIN CALCIUM 40 MG PO TABS
40.0000 mg | ORAL_TABLET | Freq: Every day | ORAL | Status: DC
Start: 2020-09-14 — End: 2020-09-15
  Administered 2020-09-14 – 2020-09-15 (×2): 40 mg via ORAL
  Filled 2020-09-14 (×2): qty 1

## 2020-09-14 MED ORDER — AMLODIPINE BESYLATE 10 MG PO TABS
10.0000 mg | ORAL_TABLET | Freq: Every day | ORAL | Status: DC
  Administered 2020-09-14 – 2020-09-15 (×2): 10 mg via ORAL
  Filled 2020-09-14 (×2): qty 1

## 2020-09-14 NOTE — TOC CAGE-AID Note (Signed)
Transition of Care [facility]) - CAGE-AID Screening   Patient Details  Name: Kennieth Plotts MRN: [id number] Date of Birth: [birth year]  Transition of Care [facility]) CM/SW Contact:    Leith Szafranski C Tarpley-Carter, LCSWA Phone Number: 09/14/2020, 10:59 AM   Clinical Narrative: Pt is unable to participate in Cage Aid.  Pt isn't appropriate, due to altered mental status.  CSW will provide pt with educational resources, due to substance use.  Trayce Maino Tarpley-Carter, MSW, LCSW-A Pronouns:  She/Her/Hers [facility] of [facility] Clinical Social Worker Direct Number:  [phone] [email or web address]   CAGE-AID Screening: Substance Abuse Screening unable to be completed due to: : Patient unable to participate (Pt isn't appropriate, due to altered mental status.)                Substance abuse interventions: Transport planner

## 2020-09-14 NOTE — Evaluation (Signed)
Occupational Therapy Evaluation Patient Details Name: Henry Fisher MRN: [id number] DOB: 14-Jan-1951 Today's Date: 09/14/2020    History of Present Illness 17 you male provided TPA 8/11 6:10PM with difficulty with speech and HA. CT 8/11 interval increase in size of subtle hyperdensity L thalamocapusular region small petechial bleed with underlying ischemic infarct  PMH HTN   Clinical Impression   PT admitted with CVA. Pt currently with functional limitiations due to the deficits listed below (see OT problem list). Pt reports to RN on arrival that he has not in fact had a stroke at all. Pt decreased awareness to problem solving and balance deficits. Pt at the end of session to resume activities slowly such as work and speak with doctors about appropriate return. Pt advised that half days to start would be better than full days / stress. Pt expressed willingness to stop work and wife expressed wanting to talk about it further. Please advise pt on work return as appropriate.  Pt will benefit from skilled OT to increase their independence and safety with adls and balance to allow discharge outpatient dynamic balance and cognitive recovery.     Follow Up Recommendations  Outpatient OT    Equipment Recommendations  None recommended by OT    Recommendations for Other Services Other (comment) (Slp outpatient)     Precautions / Restrictions Precautions Precautions: Fall Precaution Comments: watch BP      Mobility Bed Mobility Overal bed mobility: Modified Independent                  Transfers Overall transfer level: Needs assistance   Transfers: Sit to/from Stand Sit to Stand: Supervision              Balance Overall balance assessment: Mild deficits observed, not formally tested                                         ADL either performed or assessed with clinical judgement   ADL Overall ADL's : Needs assistance/impaired     Grooming: Wash/dry  hands;Oral care;Min guard;Standing Grooming Details (indicate cue type and reason): pt washing hands without soap and forgetting to dry them without cues. when cued pt states "oh its because of all these wires"             Lower Body Dressing: Supervision/safety Lower Body Dressing Details (indicate cue type and reason): sitting figure 4 to don Toilet Transfer: Min guard   Toileting- Clothing Manipulation and Hygiene: Min guard Toileting - Clothing Manipulation Details (indicate cue type and reason): pt does not flush commode , toilet paper not in the commode. pt cued to remain sitting and call for help when finished. pt reports he is finished but is already standing.     Functional mobility during ADLs: Min guard General ADL Comments: pt demonstrate poor safety awareness on stairs not using hand rail but self reports always using rails. pt reports not using it due to lines so lines are removed. pt verbally cued "used the rail" pt states "No i dont need it"     Vision Baseline Vision/History: Wears glasses Wears Glasses:  (driving and outside walking) Additional Comments: no glasses present at this time to assess baseline vision     Perception     Praxis      Pertinent Vitals/Pain Pain Assessment: No/denies pain     Hand Dominance Right  Extremity/Trunk Assessment Upper Extremity Assessment Upper Extremity Assessment: Overall WFL for tasks assessed   Lower Extremity Assessment Lower Extremity Assessment: Defer to PT evaluation   Cervical / Trunk Assessment Cervical / Trunk Assessment: Kyphotic   Communication Communication Communication: No difficulties   Cognition Arousal/Alertness: Awake/alert Behavior During Therapy: Impulsive Overall Cognitive Status: Impaired/Different from baseline Area of Impairment: Memory;Attention;Following commands;Safety/judgement;Awareness;Problem solving                   Current Attention Level: Selective Memory: Decreased  recall of precautions Following Commands: Follows multi-step commands with increased time Safety/Judgement: Decreased awareness of deficits Awareness: Emergent Problem Solving: Slow processing General Comments: pt with increased time to answer questions and repeating questions back to delay response. wife reports pt is slower to answer than his normal baseline at this time. pt expressed feeling as if he would stop work project and that knowing he can't do it might be worse than trying and misleading the team.   General Comments  supine 145/85 with movement 160/90 160/98 165/95 at end of session in chair    Exercises     Shoulder Instructions      Home Living Family/patient expects to be discharged to:: Private residence Living Arrangements: Spouse/significant other Available Help at Discharge: Family Type of Home: House Home Access: Stairs to enter [employer] of Steps: 4 (garage) Entrance Stairs-Rails: Left Home Layout: Two level;Able to live on main level with bedroom/bathroom     Bathroom Shower/Tub: Producer, television/film/video: Standard     Home Equipment: None   Additional Comments: driving working . walk (daily 30 minutes ), exercise on bike in the house  Lives With: Spouse    Prior Functioning/Environment Level of Independence: Independent                 OT Problem List: Decreased activity tolerance;Impaired balance (sitting and/or standing);Decreased cognition;Decreased safety awareness      OT Treatment/Interventions: Self-care/ADL training;Therapeutic exercise;Neuromuscular education;Therapeutic activities;Cognitive remediation/compensation;Patient/family education;Balance training    OT Goals(Current goals can be found in the care plan section) Acute Rehab OT Goals Patient Stated Goal: concerns for work related group work OT Goal Formulation: With patient/family Time For Goal Achievement: 09/28/20 Potential to Achieve Goals: Good  OT  Frequency: Min 2X/week   Barriers to D/C:            Co-evaluation              AM-PAC OT "6 Clicks" Daily Activity     Outcome Measure Help from another person eating meals?: None Help from another person taking care of personal grooming?: None Help from another person toileting, which includes using toliet, bedpan, or urinal?: A Little Help from another person bathing (including washing, rinsing, drying)?: A Little Help from another person to put on and taking off regular upper body clothing?: None Help from another person to put on and taking off regular lower body clothing?: None 6 Click Score: 22   End of Session Nurse Communication: Mobility status;Precautions  Activity Tolerance: Patient tolerated treatment well Patient left: in chair;with call bell/phone within reach;with chair alarm set;with family/visitor present  OT Visit Diagnosis: Unsteadiness on feet (R26.81);Cognitive communication deficit (R41.841) Symptoms and signs involving cognitive functions: Cerebral infarction;Nontraumatic intracerebral hemorrhage                Time: 1610-9604 OT Time Calculation (min): 51 min Charges:  OT General Charges $OT Visit: 1 Visit OT Evaluation $OT Eval Moderate Complexity: 1 Mod OT  Treatments $Self Care/Home Management : 8-22 mins   Brynn, OTR/L  Acute Rehabilitation Services Pager: [phone] Office: [phone] .   Mateo Flow 09/14/2020, 12:25 PM

## 2020-09-14 NOTE — TOC Initial Note (Signed)
Transition of Care [facility]) - Initial/Assessment Note    Patient Details  Name: Henry Fisher MRN: [id number] Date of Birth: 29-Sep-1950  [facility]) CM/SW Contact:    Glennon Mac, RN Phone Number: 09/14/2020, 4:56 PM  Clinical Narrative:    70 yo male provided TPA 8/11 6:10PM with difficulty with speech and HA. CT 8/11 interval increase in size of subtle hyperdensity L thalamocapusular region small petechial bleed with underlying ischemic infarct.  Prior to admission, patient independent living at home with spouse.  Wife/family able to provide 24-hour care at discharge.  PT/OT recommending outpatient therapy; will refer to [facility] for follow-up.            Expected Discharge Plan: OP Rehab Barriers to Discharge: Continued Medical Work up           Expected Discharge Plan and Services Expected Discharge Plan: OP Rehab   Discharge Planning Services: CM Consult   Living arrangements for the past 2 months: Single Family Home                                      Prior Living Arrangements/Services Living arrangements for the past 2 months: Single Family Home Lives with:: Spouse Patient language and need for interpreter reviewed:: Yes Do you feel safe going back to the place where you live?: Yes      Need for Family Participation in Patient Care: Yes (Comment) Care giver support system in place?: Yes (comment)   Criminal Activity/Legal Involvement Pertinent to Current Situation/Hospitalization: No - Comment as needed  Activities of Daily Living      Permission Sought/Granted                  Emotional Assessment Appearance:: Appears stated age   Affect (typically observed): Appropriate Orientation: : Oriented to Self, Oriented to Place, Oriented to  Time, Oriented to Situation      Admission diagnosis:  CVA (cerebral vascular accident) (HCC) [I63.9] Acute ischemic stroke [facility]) [I63.9] Patient Active Problem List    Diagnosis Date Noted   CVA (cerebral vascular accident) (HCC) 09/13/2020   Acute ischemic stroke (HCC) 09/13/2020   Essential hypertension, benign 10/25/2015   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 [place] [address] Phone: [phone] Fax: [phone]     Social Determinants of Health (SDOH) Interventions    Readmission Risk Interventions No flowsheet data found.  Quintella Baton, RN, BSN  Trauma/Neuro ICU Case Manager [phone]

## 2020-09-14 NOTE — Progress Notes (Addendum)
STROKE TEAM PROGRESS NOTE   ATTENDING NOTE: I reviewed above note and agree with the assessment and Fisher. Pt was seen and examined.   70 year old male with history of hypertension admitted for episode of altered feeling, speech difficulty, left-sided headache, vomiting x1 with high BP.  CT no acute abnormality.  Status post tPA.  CTA head and neck unremarkable.  Repeat CT showed slight increase of left thalamus density, concerning for tiny hemorrhage.  No tPA reversal needed.  Serial CT repeat overnight shows stable slight hyperdensity at the left thalamus.  EF 55 to 60%, LE venous Doppler negative for DVT.  A1c 6.7, LDL 149, creatinine 1.02.  MRI showed left thalamic small infarct with small hemorrhagic conversion, chronic left BG/CR lacunar infarct.  On exam, patient neurologically intact this morning.  NIH score 0.  Etiology for patient's symptoms likely due to small left thalamic infarct with petechial hemorrhagic transformation, small vessel source.  Put on aspirin 81 and Lipitor 40.  PT/OT recommend outpatient PT/OT.  For detailed assessment and Fisher, please refer to above as I have made changes wherever appropriate.   This patient is critically ill due to stroke status post tPA, hemorrhagic conversion and at significant risk of neurological worsening, death form recurrent stroke, bleeding from tPA, worsening cerebral hemorrhage. This patient's care requires constant monitoring of vital signs, hemodynamics, respiratory and cardiac monitoring, review of multiple databases, neurological assessment, discussion with family, other specialists and medical decision making of high complexity. I spent 50 minutes of neurocritical care time in the care of this patient. I had long discussion with wife and husband at bedside, updated pt current condition, treatment Fisher and potential prognosis, and answered all the questions.  They expressed understanding and appreciation.    Henry Plan, MD PhD Stroke  Neurology 09/14/2020 7:37 PM    SUBJECTIVE (INTERVAL HISTORY) His wife and daughter is at the bedside.  Overall his condition is stable.  Patient declines having ongoing headache or aphasia today.  Just reports feeling grumpy and in a bad mood due to being hungry and in the hospital.   OBJECTIVE Temp:  [97.6 F (36.4 C)-99.1 F (37.3 C)] 98.9 F (37.2 C) (08/12 0800) Pulse Rate:  [58-88] 76 (08/12 0900) Resp:  [12-27] 20 (08/12 0900) BP: (107-200)/(68-123) 133/78 (08/12 0900) SpO2:  [88 %-97 %] 90 % (08/12 0900) Weight:  [97.5 kg-98.2 kg] 97.5 kg (08/11 1726)  Recent Labs  Lab 09/13/20 1452  GLUCAP 137*   Recent Labs  Lab 09/13/20 1500  NA 136  K 4.1  CL 100  CO2 27  GLUCOSE 126*  BUN 14  CREATININE 1.02  CALCIUM 10.4*   Recent Labs  Lab 09/13/20 1500  AST 28  ALT 32  ALKPHOS 64  BILITOT 0.7  PROT 8.0  ALBUMIN 4.5   Recent Labs  Lab 09/13/20 1500  WBC 7.9  NEUTROABS 3.7  HGB 17.5*  HCT 50.6  MCV 89.9  PLT 264   No results for input(s): CKTOTAL, CKMB, CKMBINDEX, TROPONINI in the last 168 hours. Recent Labs    09/13/20 1500  LABPROT 12.8  INR 1.0   Recent Labs    09/13/20 1452  COLORURINE COLORLESS*  LABSPEC 1.022  PHURINE 7.0  GLUCOSEU NEGATIVE  HGBUR NEGATIVE  BILIRUBINUR NEGATIVE  KETONESUR NEGATIVE  PROTEINUR NEGATIVE  NITRITE NEGATIVE  LEUKOCYTESUR NEGATIVE       Component Value Date/Time   CHOL 210 (H) 09/14/2020 0433   TRIG 121 09/14/2020 0433   HDL 37 (L) 09/14/2020  0433   CHOLHDL 5.7 09/14/2020 0433   VLDL 24 09/14/2020 0433   LDLCALC 149 (H) 09/14/2020 0433   Lab Results  Component Value Date   HGBA1C 6.7 (H) 09/14/2020      Component Value Date/Time   LABOPIA NONE DETECTED 09/13/2020 1452   COCAINSCRNUR NONE DETECTED 09/13/2020 1452   LABBENZ NONE DETECTED 09/13/2020 1452   AMPHETMU NONE DETECTED 09/13/2020 1452   THCU POSITIVE (A) 09/13/2020 1452   LABBARB NONE DETECTED 09/13/2020 1452    Recent Labs  Lab  09/13/20 1509  ETH 11*    I have personally reviewed the radiological images below and agree with the radiology interpretations.  CT HEAD WO CONTRAST (5MM)  Result Date: 09/14/2020 CLINICAL DATA:  Stroke.  Post tPA administration. EXAM: CT HEAD WITHOUT CONTRAST TECHNIQUE: Contiguous axial images were obtained from the base of the skull through the vertex without intravenous contrast. COMPARISON:  CT head 09/13/2020 FINDINGS: Brain: No change in slight hyperdensity left thalamus with surrounding hypodensity. This is a subtle finding. In reviewing prior studies, I believe this was present prior to tPA administration. Given the very slight density above cortex, this could be petechial acute hemorrhage or possibly chronic hemorrhage. MRI may be helpful. Otherwise no new area of hemorrhage Generalized atrophy. Extensive white matter hypodensity diffusely unchanged. No new area of infarct, hemorrhage, mass. Vascular: Negative for hyperdense vessel Skull: Negative Sinuses/Orbits: Minimal mucosal edema right maxillary sinus. Remaining sinuses clear. Negative orbit Other: None IMPRESSION: No significant interval change. Subtle hyperdensity left thalamus again noted. This appears to be present on the pre tPA CT of 09/13/2020 suggesting this may not be acute hemorrhage. MRI may be helpful. Electronically Signed   By: Marlan Palauharles  Clark M.D.   On: 09/14/2020 07:58   CT HEAD WO CONTRAST (5MM)  Result Date: 09/14/2020 CLINICAL DATA:  Follow-up examination for acute stroke, status post tPA. EXAM: CT HEAD WITHOUT CONTRAST TECHNIQUE: Contiguous axial images were obtained from the base of the skull through the vertex without intravenous contrast. COMPARISON:  Prior CTs from earlier the same day. FINDINGS: Brain: Previously identified subtle hyperdensity at the left thalamic capsular region again seen, measuring slightly increased in size at up to approximately 1 cm, previously 7 mm (series 3, image 19). Again, finding is  suggestive of a small petechial bleed within underlying ischemic infarct. No significant mass effect. Otherwise, the remainder the brain is otherwise stable with no other acute intracranial hemorrhage or visible evolving ischemia. Atrophy with advanced chronic microvascular ischemic disease again noted. No midline shift or hydrocephalus. No extra-axial collection. Vascular: No asymmetric hyperdense vessel. Calcified atherosclerosis at the skull base noted. Skull: No new abnormality. Sinuses/Orbits: Globes and orbital soft tissues demonstrate no acute finding. Paranasal sinuses and mastoid air cells remain largely clear. Other: None. IMPRESSION: 1. Slight interval increase in size of subtle hyperdensity at the left thalamocapsular region, suggestive of a small petechial bleed in the setting of an underlying ischemic infarct. No significant mass effect. 2. No other new acute intracranial abnormality. 3. 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 Electronically Signed   By: Rise MuBenjamin  McClintock M.D.   On: 09/14/2020 01:28   CT HEAD WO CONTRAST (5MM)  Result Date: 09/13/2020 CLINICAL DATA:  Follow-up stroke presentation with tPA administration. Neuro deficit, acute, stroke suspected. EXAM: CT HEAD WITHOUT CONTRAST TECHNIQUE: Contiguous axial images were obtained from the base of the skull through the vertex without intravenous contrast. COMPARISON:  Same day 1500 hours. FINDINGS: Brain: No abnormality seen affecting the  brainstem or cerebellum. Within the left thalamus, there is a 6-7 mm focus of low level hyperdensity. This may be visible in retrospect on the prior study but is slightly more dense now, suggesting low level petechial bleeding in a thalamic infarction. No other acute infarction or hemorrhage is identified. There are extensive chronic small-vessel ischemic changes throughout the cerebral hemispheric white matter. No hydrocephalus or extra-axial collection. Vascular: There is  atherosclerotic calcification of the major vessels at the base of the brain. Skull: Negative Sinuses/Orbits: Clear/normal Other: None IMPRESSION: Since the study 3-4 hours ago, there is a 6-7 mm region of slight hyperdensity in the left thalamus most consistent with petechial bleeding in a left thalamic infarction. No frank hematoma. No other change. Chronic small-vessel ischemic changes throughout the cerebral hemispheric white matter. Findings discussed with Dr. Wilford Corner at approximately 1850 hours. Electronically Signed   By: Paulina Fusi M.D.   On: 09/13/2020 18:59   CT HEAD CODE STROKE WO CONTRAST  Result Date: 09/13/2020 CLINICAL DATA:  Code stroke. EXAM: CT HEAD WITHOUT CONTRAST TECHNIQUE: Contiguous axial images were obtained from the base of the skull through the vertex without intravenous contrast. COMPARISON:  None. FINDINGS: Brain: There is no acute intracranial hemorrhage, mass effect, or edema. Gray-white differentiation is preserved. Prominence of the ventricles and sulci reflects generalized parenchymal volume loss. Patchy and confluent areas of hypoattenuation in the supratentorial white matter nonspecific but may reflect moderate to marked chronic microvascular ischemic changes. Vascular: No hyperdense vessel. Skull: Unremarkable. Sinuses/Orbits: No acute abnormality. Other: Mastoid air cells are clear. ASPECTS (Alberta Stroke Program Early CT Score) - Ganglionic level infarction (caudate, lentiform nuclei, internal capsule, insula, M1-M3 cortex): 7 - Supraganglionic infarction (M4-M6 cortex): 3 Total score (0-10 with 10 being normal): 10 IMPRESSION: There is no acute intracranial hemorrhage or evidence of acute infarction. ASPECT score is 10. Chronic microvascular ischemic changes. These results were called by telephone at the time of interpretation on 09/13/2020 at 3:13 pm to provider Pricilla Loveless , who verbally acknowledged these results. Electronically Signed   By: Guadlupe Spanish M.D.   On:  09/13/2020 15:17   CT ANGIO HEAD CODE STROKE  Result Date: 09/13/2020 CLINICAL DATA:  Receptive aphasia, code stroke follow-up EXAM: CT ANGIOGRAPHY HEAD AND NECK TECHNIQUE: Multidetector CT imaging of the head and neck was performed using the standard protocol during bolus administration of intravenous contrast. Multiplanar CT image reconstructions and MIPs were obtained to evaluate the vascular anatomy. Carotid stenosis measurements (when applicable) are obtained utilizing NASCET criteria, using the distal internal carotid diameter as the denominator. CONTRAST:  73mL OMNIPAQUE IOHEXOL 350 MG/ML SOLN COMPARISON:  None. FINDINGS: CTA NECK Aortic arch: Mild plaque along the aortic arch. Great vessel origins are patent. Right carotid system: Patent.  No stenosis. Left carotid system: Patent. Trace calcified plaque at the common carotid bifurcation. No stenosis. Vertebral arteries: Patent. Left vertebral artery is dominant. No stenosis. Skeleton: Degenerative changes of the cervical spine. Other neck: Unremarkable. Upper chest: Included upper lungs are clear. Review of the MIP images confirms the above findings CTA HEAD Anterior circulation: Intracranial internal carotid arteries are patent with trace calcified plaque. Anterior cerebral arteries are patent. Middle cerebral arteries are patent. There is moderate stenosis of the proximal left M1 MCA. Posterior circulation: Intracranial vertebral arteries are patent. Basilar artery is patent with mild atherosclerotic irregularity. Major cerebellar artery origins are patent. Posterior cerebral arteries are patent. Venous sinuses: Not well evaluated. Review of the MIP images confirms the above findings IMPRESSION: No  occlusion or hemodynamically significant stenosis in the neck. No evidence of dissection. No proximal intracranial vessel occlusion. 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 Electronically Signed   By: Guadlupe Spanish M.D.   On: 09/13/2020 16:20   CT  ANGIO NECK CODE STROKE  Result Date: 09/13/2020 CLINICAL DATA:  Receptive aphasia, code stroke follow-up EXAM: CT ANGIOGRAPHY HEAD AND NECK TECHNIQUE: Multidetector CT imaging of the head and neck was performed using the standard protocol during bolus administration of intravenous contrast. Multiplanar CT image reconstructions and MIPs were obtained to evaluate the vascular anatomy. Carotid stenosis measurements (when applicable) are obtained utilizing NASCET criteria, using the distal internal carotid diameter as the denominator. CONTRAST:  75mL OMNIPAQUE IOHEXOL 350 MG/ML SOLN COMPARISON:  None. FINDINGS: CTA NECK Aortic arch: Mild plaque along the aortic arch. Great vessel origins are patent. Right carotid system: Patent.  No stenosis. Left carotid system: Patent. Trace calcified plaque at the common carotid bifurcation. No stenosis. Vertebral arteries: Patent. Left vertebral artery is dominant. No stenosis. Skeleton: Degenerative changes of the cervical spine. Other neck: Unremarkable. Upper chest: Included upper lungs are clear. Review of the MIP images confirms the above findings CTA HEAD Anterior circulation: Intracranial internal carotid arteries are patent with trace calcified plaque. Anterior cerebral arteries are patent. Middle cerebral arteries are patent. There is moderate stenosis of the proximal left M1 MCA. Posterior circulation: Intracranial vertebral arteries are patent. Basilar artery is patent with mild atherosclerotic irregularity. Major cerebellar artery origins are patent. Posterior cerebral arteries are patent. Venous sinuses: Not well evaluated. Review of the MIP images confirms the above findings IMPRESSION: No occlusion or hemodynamically significant stenosis in the neck. No evidence of dissection. No proximal intracranial vessel occlusion. 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 Electronically Signed   By: Guadlupe Spanish M.D.   On: 09/13/2020 16:20     TTE   Conclusion(s)/Recommendation(s): No intracardiac source of embolism  detected on this transthoracic study. A transesophageal echocardiogram is  recommended to exclude cardiac source of embolism if clinically indicated.    PHYSICAL EXAM  Temp:  [97.6 F (36.4 C)-99.1 F (37.3 C)] 98.9 F (37.2 C) (08/12 0800) Pulse Rate:  [58-88] 76 (08/12 0900) Resp:  [12-27] 20 (08/12 0900) BP: (107-200)/(68-123) 133/78 (08/12 0900) SpO2:  [88 %-97 %] 90 % (08/12 0900) Weight:  [97.5 kg-98.2 kg] 97.5 kg (08/11 1726)  General - Well nourished, well developed, in no apparent distress.  Ophthalmologic - fundi not visualized due to noncooperation.  Cardiovascular - Regular rhythm and rate.  Mental Status -  Level of arousal and orientation to time, place, and person were intact. Language including expression, naming, repetition, comprehension was assessed and found intact. Attention span and concentration were normal. Recent and remote memory were intact. Fund of Knowledge was assessed and was intact.  Cranial Nerves II - XII - II - Visual field intact OU. III, IV, VI - Extraocular movements intact. V - Facial sensation intact bilaterally. VII - Facial movement intact bilaterally. VIII - Hearing & vestibular intact bilaterally. X - Palate elevates symmetrically. XI - Chin turning & shoulder shrug intact bilaterally. XII - Tongue protrusion intact.  Motor Strength - The patient's strength was normal in all extremities and pronator drift was absent.  Bulk was normal and fasciculations were absent.   Motor Tone - Muscle tone was assessed at the neck and appendages and was normal.  Reflexes - The patient's reflexes were symmetrical in all extremities and he had no pathological reflexes.  Sensory -  Light touch, temperature/pinprick were assessed and were symmetrical.    Coordination - The patient had normal movements in the hands and feet with no ataxia or dysmetria.  Tremor was  absent.  Gait and Station - deferred.   ASSESSMENT/Fisher [name] is a 70 y.o. male with history of hypertension admitted for sudden onset expressive and receptive aphasia along with left-sided headache.  tPA was administered, patient complained of headache after tPA at which time CT showed showed subtle hypodensity of left thalamus, repeat CT showed no interval change, likely not a bleed.  Subacute left lateral thalamic infarction, with small focus of hemorrhage with mild surrounding edema-stable MRI as above CTA head/neck as above 2D Echo  normal LDL 149 HgbA1c 6.7 SCDs for VTE prophylaxis No antithrombotic prior to admission, now on No antithrombotic.  Ongoing aggressive stroke risk factor management Therapy recommendations:  pending Disposition:  pending  Diabetes HgbA1c 6.7% goal < 7.0 Not on meds at home, will need to be started on discharge CBG monitoring SSI DM education and close PCP follow up  Hypertension Stable Goal 130-150 SBP Long term BP goal normotensive  Hyperlipidemia Home meds:  none  LDL 149, goal < 70 Now on lipitor Continue statin at discharge  Other Stroke Risk Factors Advanced age   Floreen Comber PA-C [facility] [phone]  Discussed with attending neurologist review of note to follow from Dr. Roda Shutters.     Henry Plan, MD PhD Stroke Neurology 09/14/2020 9:40 AM    To contact Stroke Continuity provider, please refer to [email or web address]. After hours, contact General Neurology

## 2020-09-14 NOTE — Evaluation (Signed)
Physical Therapy Evaluation Patient Details Name: Henry Fisher MRN: [id number] DOB: 21-Apr-1950 Today's Date: 09/14/2020   History of Present Illness  88 you male provided TPA 8/11 6:10PM with difficulty with speech and HA. CT 8/11 interval increase in size of subtle hyperdensity L thalamocapusular region small petechial bleed with underlying ischemic infarct  PMH HTN   Clinical Impression  Pt in bed upon arrival of PT, agreeable to evaluation at this time. Prior to admission the pt was independent with all mobility, working full time, living with his wife in a home with 4 steps to enter. The pt now presents with limitations in functional mobility, dynamic stability, SLS, and safety awareness due to above dx, and will continue to benefit from skilled PT to address these deficits. The pt was able to mobilize both in the room and in the hallway on minG level without need for AD of assist. The pt's deficits in higher-level dynamic balance and SLS were exacerbated by poor safety awareness, awareness of deficits, and problem solving with mobility. Concerns were voiced to the pt and his wife, recommend continued skilled PT acutely as well as OP neuro PT following d/c to further reduce risk of falls and facilitate return to pt's prior level of mobility.      Follow Up Recommendations Outpatient PT;Supervision for mobility/OOB    Equipment Recommendations  None recommended by PT    Recommendations for Other Services       Precautions / Restrictions Precautions Precautions: Fall Precaution Comments: watch BP Restrictions Weight Bearing Restrictions: No      Mobility  Bed Mobility Overal bed mobility: Modified Independent                  Transfers Overall transfer level: Needs assistance Equipment used: None Transfers: Sit to/from Stand Sit to Stand: Supervision         General transfer comment: supervision for safety, no physical assist to power up. pt completed  impulsively in bathroom without supervision without LOB  Ambulation/Gait Ambulation/Gait assistance: Supervision Gait Distance (Feet): 200 Feet Assistive device: None Gait Pattern/deviations: Step-through pattern Gait velocity: 0.5 m/s Gait velocity interpretation: 1.31 - 2.62 ft/sec, indicative of limited community ambulator General Gait Details: no overt LOB, pt with slightly increased lateral sway, especially with increased challenge, stepping over objects, and navigating in tight spaces. pt unable to way-find back to room, despite being told room numbers stating "I bet you wanted me to read the room numbers, but I don't know what number I am looking for" about 5 min after being told his room number  Stairs Stairs: Yes Stairs assistance: Min assist Stair Management: One rail Right;Alternating pattern;No rails;Forwards Number of Stairs: 4 General stair comments: pt completing without buckling, using rail to ascend, with marked increase in instability with descending and pt letting go of rail. pt unable to explain rationale  Wheelchair Mobility    Modified Rankin (Stroke Patients Only) Modified Rankin (Stroke Patients Only) Pre-Morbid Rankin Score: No symptoms Modified Rankin: Moderately severe disability     Balance Overall balance assessment: Mild deficits observed, not formally tested                                           Pertinent Vitals/Pain Pain Assessment: No/denies pain    Home Living Family/patient expects to be discharged to:: Private residence Living Arrangements: Spouse/significant other Available Help at Discharge: Family Type  of Home: House Home Access: Stairs to enter Entrance Stairs-Rails: Left Entrance Stairs-Number of Steps: 4 (from garage) Home Layout: Two level;Able to live on main level with bedroom/bathroom Home Equipment: None Additional Comments: driving working . walk (daily 30 minutes ), exercise on bike in the house     Prior Function Level of Independence: Independent         Comments: pt reports many falls "reccently" both walking and off bike     Hand Dominance   Dominant Hand: Right    Extremity/Trunk Assessment   Upper Extremity Assessment Upper Extremity Assessment: Defer to OT evaluation    Lower Extremity Assessment Lower Extremity Assessment: Overall WFL for tasks assessed    Cervical / Trunk Assessment Cervical / Trunk Assessment: Kyphotic  Communication   Communication: No difficulties  Cognition Arousal/Alertness: Awake/alert Behavior During Therapy: Impulsive Overall Cognitive Status: Impaired/Different from baseline Area of Impairment: Memory;Attention;Following commands;Safety/judgement;Awareness;Problem solving                   Current Attention Level: Selective Memory: Decreased recall of precautions;Decreased short-term memory Following Commands: Follows multi-step commands with increased time Safety/Judgement: Decreased awareness of deficits Awareness: Emergent Problem Solving: Slow processing General Comments: pt with increased time to answer questions and repeating questions back to delay response. wife reports pt is slower to answer than his normal baseline at this time. Pt with decreased safety awareness with mobility, needing cues to utilize safety features such as rail as pt denying deficits. Pt unable to recall room number even after being told.      General Comments General comments (skin integrity, edema, etc.): supine 145/85 with movement 160/90 160/98 165/95 at end of session in chair    Exercises     Assessment/Plan    PT Assessment Patient needs continued PT services  PT Problem List Decreased range of motion;Decreased activity tolerance;Decreased balance;Decreased mobility;Decreased coordination;Decreased cognition;Decreased safety awareness       PT Treatment Interventions DME instruction;Stair training;Gait training;Functional  mobility training;Therapeutic activities;Therapeutic exercise;Balance training;Neuromuscular re-education;Patient/family education    PT Goals (Current goals can be found in the Care Plan section)  Acute Rehab PT Goals Patient Stated Goal: concerns for work related group work PT Goal Formulation: With patient Time For Goal Achievement: 09/28/20 Potential to Achieve Goals: Good    Frequency Min 4X/week    AM-PAC PT "6 Clicks" Mobility  Outcome Measure Help needed turning from your back to your side while in a flat bed without using bedrails?: None Help needed moving from lying on your back to sitting on the side of a flat bed without using bedrails?: None Help needed moving to and from a bed to a chair (including a wheelchair)?: A Little Help needed standing up from a chair using your arms (e.g., wheelchair or bedside chair)?: A Little Help needed to walk in hospital room?: A Little Help needed climbing 3-5 steps with a railing? : A Little 6 Click Score: 20    End of Session Equipment Utilized During Treatment: Gait belt Activity Tolerance: Patient tolerated treatment well Patient left: in chair;with call bell/phone within reach;with chair alarm set;with family/visitor present Nurse Communication: Mobility status PT Visit Diagnosis: Other abnormalities of gait and mobility (R26.89)    Time: 7371-0626 PT Time Calculation (min) (ACUTE ONLY): 46 min   Charges:   PT Evaluation $PT Eval Moderate Complexity: 1 Mod          Amaka Gluth [place] Hun, PT, DPT   Acute Rehabilitation Department Pager #: [phone] -  [phone]  Ronnie Derby 09/14/2020, 1:47 PM

## 2020-09-14 NOTE — Plan of Care (Addendum)
Head CT personally reviewed, agree with radiology that there is a very slight increase in hyperdensity.  Headache / nausea have resolved per nursing and exam remains stable.  Blood pressures have recently been in the 120s/70s Given overall clinical picture, will continue to hold on tPA reversal. Will continue to closely monitor neurological status, with every 2 hours neurochecks, and repeat HCT at 4 AM  Brooke Dare MD-PhD [facility] [phone]  Available 7 PM to 7 AM, outside of these hours please call Neurologist on call as listed on Amion.   Addendum.  Reviewed head CT from 4 AM, radiology read still pending at this time however I do not feel that there has been a clinically significant increase in the hyperdensity though there may again be a slight increase versus stable bleed radiographically

## 2020-09-14 NOTE — Progress Notes (Signed)
*  PRELIMINARY RESULTS* Echocardiogram 2D Echocardiogram has been performed.  Neomia Dear RDCS 09/14/2020, 3:33 PM

## 2020-09-14 NOTE — Evaluation (Signed)
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 CT 8/11 Slight interval increase in size of subtle hyperdensity at the  left thalamocapsular region, suggestive of a small petechial bleed in the setting of an underlying ischemic infarct. No significant  mass effect. PMH: HTN   Assessment / Plan / Recommendation Clinical Impression  Pt presents with mild-moderate deficits as evidenced on SLUMS cognitive evaluation. Mild hearing loss noted in conversation which pt admitted. He stored the 5 words but only  stated with semantic cues. He currently works full time as a Psychologist, educational for [employer]. He demonstrated difficulty marking time on clock with max cues and hyperfocused on his thought process. Emergent and anticipatory awareness were reduced. Suspect baseline now mildly exacerbated sustained and selective attention issues (asked therapist to turn off beeping monitor). Pt does not feel that he needs assist from cognitive standpoint. Therapist explained reasoning for another session at minimum as well as recommendation for PT/OT fro further ST.    SLP Assessment  SLP Visit Diagnosis: Cognitive communication deficit (R41.841)    Follow Up Recommendations  Other (comment) (may  not require at discharge)    Frequency and Duration min 2x/week  2 weeks      SLP Evaluation Cognition  Overall Cognitive Status: Impaired/Different from baseline Arousal/Alertness: Awake/alert Orientation Level: Oriented X4 Attention: Sustained Sustained Attention: Appears intact Memory: Impaired Memory Impairment: Retrieval deficit Awareness: Impaired Awareness Impairment: Emergent impairment;Anticipatory impairment Problem Solving: Impaired Problem Solving Impairment: Functional basic Safety/Judgment: Appears intact       Comprehension  Auditory Comprehension Overall Auditory Comprehension: Appears within functional limits for tasks assessed Visual Recognition/Discrimination Discrimination: Not tested    Expression Expression Primary Mode of Expression: Verbal Verbal Expression Overall Verbal Expression: Appears within functional limits for tasks assessed Repetition:  (NT) Naming: No impairment Pragmatics: No impairment Written Expression Written Expression: Not tested   Oral / Motor  Oral Motor/Sensory Function Overall Oral Motor/Sensory Function: Within functional limits Motor Speech Overall Motor Speech: Appears within functional limits for tasks assessed Articulation: Within functional limitis Intelligibility: Intelligible Motor Planning: Witnin functional limits   GO                    Henry Fisher 09/14/2020, 10:50 AM Henry Fisher Henry Fisher M.Ed Nurse, children's [phone] Office [phone]

## 2020-09-14 NOTE — Progress Notes (Signed)
Lower extremity venous has been completed.   Preliminary results in CV Proc.   Blanch Media 09/14/2020 1:35 PM

## 2020-09-15 MED ORDER — ATORVASTATIN CALCIUM 40 MG PO TABS: 40.0000 mg | ORAL_TABLET | Freq: Every day | ORAL | 3 refills | Status: AC

## 2020-09-15 MED ORDER — ASPIRIN 81 MG PO TBEC: 81.0000 mg | DELAYED_RELEASE_TABLET | Freq: Every day | ORAL | 11 refills | Status: AC

## 2020-09-15 MED ORDER — DEXAMETHASONE 0.5 MG/5ML PO SOLN
0.5000 mg | Freq: Every day | ORAL | Status: DC | PRN
Start: 2020-09-15 — End: 2020-09-15
  Filled 2020-09-15: qty 5

## 2020-09-15 NOTE — Discharge Summary (Deleted)
STROKE TEAM PROGRESS NOTE   SUBJECTIVE  His wife and daughter are at the bedside. [name] reports feeling well. Declines neurological symptoms.   OBJECTIVE Vitals:   09/14/20 2329 09/15/20 0342 09/15/20 0827 09/15/20 1051  BP: (!) 116/59 127/84 (!) 143/87 (!) 152/86  Pulse: (!) 59 76 75 84  Resp: 17 19 19 17   Temp: 98.2 F (36.8 C) 98.2 F (36.8 C) 98.4 F (36.9 C) 97.9 F (36.6 C)  TempSrc: Oral Oral Oral Oral  SpO2: 95% 94% 93% 95%  Weight:        CBC:  Recent Labs  Lab 09/13/20 1500  WBC 7.9  NEUTROABS 3.7  HGB 17.5*  HCT 50.6  MCV 89.9  PLT 264    Basic Metabolic Panel:  Recent Labs  Lab 09/13/20 1500  NA 136  K 4.1  CL 100  CO2 27  GLUCOSE 126*  BUN 14  CREATININE 1.02  CALCIUM 10.4*    Lipid Panel:  Recent Labs  Lab 09/14/20 0433  CHOL 210*  TRIG 121  HDL 37*  CHOLHDL 5.7  VLDL 24  LDLCALC 11/14/20*   HgbA1c:  Lab Results  Component Value Date   HGBA1C 6.7 (H) 09/14/2020   Urine Drug Screen:     Component Value Date/Time   LABOPIA NONE DETECTED 09/13/2020 1452   COCAINSCRNUR NONE DETECTED 09/13/2020 1452   LABBENZ NONE DETECTED 09/13/2020 1452   AMPHETMU NONE DETECTED 09/13/2020 1452   THCU POSITIVE (A) 09/13/2020 1452   LABBARB NONE DETECTED 09/13/2020 1452    Alcohol Level     Component Value Date/Time   ETH 11 (H) 09/13/2020 1509    IMAGING  Results for orders placed or performed during the hospital encounter of 09/13/20  MR BRAIN WO CONTRAST   Narrative   CLINICAL DATA:  Follow-up stroke.  Speech disturbance and headache.  EXAM: MRI HEAD WITHOUT CONTRAST  TECHNIQUE: Multiplanar, multiecho pulse sequences of the brain and surrounding structures were obtained without intravenous contrast.  COMPARISON:  Multiple CT examinations yesterday and earlier today.  FINDINGS: Brain: No focal abnormality affects the brainstem or cerebellum. There is cerebellar volume loss. Cerebral hemispheres show a small hemorrhagic  infarction of the left lateral thalamus as seen on the previous CT scans. This does not appear to be enlarging when comparing techniques. No acute infarction elsewhere. Small foci of hemosiderin deposition also seen in the right thalamus and at the genu of the internal capsule on the left. There are chronic and confluent small-vessel ischemic changes affecting the cerebral hemispheric white matter. No sign of large vessel territory infarction. No hydrocephalus. No extra-axial collection.  Vascular: Major vessels at the base of the brain show flow.  Skull and upper cervical spine: Negative  Sinuses/Orbits: Clear/normal  Other: None  IMPRESSION: Probably subacute left lateral thalamic infarction with a small focus of hemorrhage and mild surrounding edema. This does not appear to have progressed over the recent imaging.  Small chronic foci of hemosiderin deposition also noted in the right thalamus and at the genu of the internal capsule on the left.  Extensive chronic small-vessel ischemic changes throughout the cerebral hemispheric white matter elsewhere.   Electronically Signed   By: 11/13/20 M.D.   On: 09/14/2020 15:22   CT HEAD WO CONTRAST (11/14/2020)   Narrative   CLINICAL DATA:  Stroke.  Post tPA administration.  EXAM: CT HEAD WITHOUT CONTRAST  TECHNIQUE: Contiguous axial images were obtained from the base of the skull through the vertex without intravenous  contrast.  COMPARISON:  CT head 09/13/2020  FINDINGS: Brain: No change in slight hyperdensity left thalamus with surrounding hypodensity. This is a subtle finding. In reviewing prior studies, I believe this was present prior to tPA administration. Given the very slight density above cortex, this could be petechial acute hemorrhage or possibly chronic hemorrhage. MRI may be helpful. Otherwise no new area of hemorrhage  Generalized atrophy. Extensive white matter hypodensity diffusely unchanged. No new area of  infarct, hemorrhage, mass.  Vascular: Negative for hyperdense vessel  Skull: Negative  Sinuses/Orbits: Minimal mucosal edema right maxillary sinus. Remaining sinuses clear. Negative orbit  Other: None  IMPRESSION: No significant interval change. Subtle hyperdensity left thalamus again noted. This appears to be present on the pre tPA CT of 09/13/2020 suggesting this may not be acute hemorrhage. MRI may be helpful.   Electronically Signed   By: Marlan Palau M.D.   On: 09/14/2020 07:58      General Exam__________________________________________________________  Vitals:   09/14/20 2329 09/15/20 0342 09/15/20 0827 09/15/20 1051  BP: (!) 116/59 127/84 (!) 143/87 (!) 152/86  Pulse: (!) 59 76 75 84  Resp: 17 19 19 17   Temp: 98.2 F (36.8 C) 98.2 F (36.8 C) 98.4 F (36.9 C) 97.9 F (36.6 C)  TempSrc: Oral Oral Oral Oral  SpO2: 95% 94% 93% 95%  Weight:         HEENT-  Normocephalic, no lesions, without obvious abnormality.  Normal external eye and conjunctiva.  Eyelids drooping - normal for patient Cardiovascular-RRR, on telemtry Lungs-breathing comfortably on room air Abdomen- All 4 quadrants palpated and nontender Musculoskeletal-no joint tenderness, deformity or swelling Skin-visible skin warm and dry, no hyperpigmentation, vitiligo, or suspicious lesions  Neurologic Exam:_______________________________________________________ General: NAD Mental Status: Alert, oriented, thought content appropriate.  Speech fluent without evidence of aphasia.  Able to follow 3 step commands without difficulty. Cranial Nerves: II:  Visual fields grossly normal, pupils equal, round and reactive to light III,IV, VI: Ptosis not present, extra-ocular motions intact bilaterally V,VII: Smile symmetric, facial light touch sensation normal bilaterally VIII: Hearing normal bilaterally IX,X: Uvula rises symmetrically XI: Shoulder shrug with equal strength bilaterally XII: Midline tongue  extension without atrophy or fasciculations  Motor: Right : Upper extremity   5/5    Left:     Upper extremity   5/5  Lower extremity   5/5     Lower extremity   5/5 Tone and Bulk: Normal throughout; no atrophy noted Sensory: light touch intact throughout, equal bilaterally Deep Tendon Reflexes:  Right: Upper Extremity:   Biceps (C-5 to C-6) 1/4       Brachioradialis (C6) 0/4   Left Upper Extremity: Biceps (C-5 to C-6) 1/4 Brachioradialis (C6) 20/4 Right Lower Extremity Quadriceps (L-2 to L-4) 1/4    Left Lower Extremity Quadriceps (L-2 to L-4) 1/4      Plantars: Downgoing bilaterally Cerebellar: FTN without evidence of ataxia or dysmetria, HTN without evidence of ataxia Gait: Deferred    ASSESSMENT/PLAN [name] is a 70 y.o. male with past medical history significant for hypertension presenting with altered feeling, aphasia, left-sided headache, vomiting x1 with high blood pressure.  CT on admission showed no acute abnormality, status post tPA.  Following intervention, [name] complained of a headache.  Repeat CT showed subtle hypodensity of the left thalamus. All neurological symptoms have resolved and [name] was neurologically intact on this visit.  Subacute left lateral thalamic infarction, with small focus of hemorrhage with mild surrounding edema-stable MRI: Probably  subacute left lateral thalamic infarction with a small focus of hemorrhage and mild surrounding edema. Extensive chronic small-vessel ischemic changes CTA head/neck: No proximal intracranial vessel occlusion. 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 No occlusion or hemodynamically significant stenosis in the neck 2D Echo normal LDL 149 HgbA1c 6.7 No antithrombotic prior to admission, now on aspirin 81 mg.  Continue at discharge Ongoing aggressive stroke risk factor management discussed with patient Disposition: Discharge to home with outpatient physical therapy     Diabetes HgbA1c 6.7% goal < 7.0 Not on meds at home, start metformin 500 mg nightly Strongly suggest DM education and close PCP follow up to assist in lifestyle modifications  Hypertension Stable Goal 130-150 SBP Long term BP goal normotensive Patient takes Tekturna 150 mg daily at home.  Continue at discharge   Hyperlipidemia Home meds:  none  LDL 149, goal < 70 On Lipitor 40.  Continue at discharge.   Other Stroke Risk Factors Advanced age  Hospital day # 2  Bruna Potter, PhD, PA-C Neurology, Stroke team

## 2020-09-15 NOTE — Plan of Care (Signed)
Nursing request:  Good evening Dr. Iver Nestle. This is Magda Paganini from [facility], this patient has a history of Pemphigoid, benign mucosal. He said mouth is bothering him and wondering if home med which is dexamethasone oral rinse can be started for relief ?   Home medication re-ordered given labs and vitals reassuring against infection and no clear contraindication documented in today's progress note.   Brooke Dare MD-PhD [facility] [phone]  Available 7 PM to 7 AM, outside of these hours please call Neurologist on call as listed on Amion.

## 2020-09-15 NOTE — Discharge Summary (Signed)
Physician Discharge Summary  Patient ID: Henry Fisher MRN: [id number] DOB/AGE: 1951/01/10 70 y.o.  Admit date: 09/13/2020 Discharge date: 09/15/2020  Admission Diagnoses:   Discharge Diagnoses:  Active Problems:   CVA (cerebral vascular accident) (HCC)   Acute ischemic stroke [facility])   Discharged Condition: good  Hospital Course: Henry Fisher is a 70 y.o. male with past medical history significant for hypertension presenting with altered feeling, aphasia, left-sided headache, vomiting x1 with high blood pressure.  CT on admission showed no acute abnormality, status post tPA.  Following intervention, Henry Fisher complained of a headache.  Repeat CT showed subtle hypodensity of the left thalamus. All neurological symptoms have resolved and Henry Fisher was neurologically intact on this visit.  Consults: neurology  Significant Diagnostic Studies: labs, radiology: CT scan: brain , and angiography: CTA head/neck  Treatments: tPA, cardiac meds, anticoagulation: ASA, and insulin  Discharge Exam: Blood pressure (!) 152/86, pulse 84, temperature 97.9 F (36.6 C), temperature source Oral, resp. rate 17, weight 97.5 kg, SpO2 95 %.    General Exam__________________________________________________________         Vitals:    09/14/20 2329 09/15/20 0342 09/15/20 0827 09/15/20 1051  BP: (!) 116/59 127/84 (!) 143/87 (!) 152/86  Pulse: (!) 59 76 75 84  Resp: 17 19 19 17   Temp: 98.2 F (36.8 C) 98.2 F (36.8 C) 98.4 F (36.9 C) 97.9 F (36.6 C)  TempSrc: Oral Oral Oral Oral  SpO2: 95% 94% 93% 95%  Weight:              HEENT-  Normocephalic, no lesions, without obvious abnormality.  Normal external eye and conjunctiva.  Eyelids drooping - normal for patient Cardiovascular-RRR, on telemtry Lungs-breathing comfortably on room air Abdomen- All 4 quadrants palpated and nontender Musculoskeletal-no joint tenderness, deformity or swelling Skin-visible skin warm and dry, no  hyperpigmentation, vitiligo, or suspicious lesions   Neurologic Exam:_______________________________________________________ General: NAD Mental Status: Alert, oriented, thought content appropriate.  Speech fluent without evidence of aphasia.  Able to follow 3 step commands without difficulty. Cranial Nerves: II:  Visual fields grossly normal, pupils equal, round and reactive to light III,IV, VI: Ptosis not present, extra-ocular motions intact bilaterally V,VII: Smile symmetric, facial light touch sensation normal bilaterally VIII: Hearing normal bilaterally IX,X: Uvula rises symmetrically XI: Shoulder shrug with equal strength bilaterally XII: Midline tongue extension without atrophy or fasciculations   Motor: Right :  Upper extremity   5/5                                      Left:     Upper extremity   5/5             Lower extremity   5/5                                                  Lower extremity   5/5 Tone and Bulk: Normal throughout; no atrophy noted Sensory: light touch intact throughout, equal bilaterally Deep Tendon Reflexes:  Right: Upper Extremity:                       Biceps (C-5 to C-6) 1/4  Brachioradialis (C6) 0/4                       Left Upper Extremity: Biceps (C-5 to C-6) 1/4 Brachioradialis (C6) 20/4 Right Lower Extremity Quadriceps (L-2 to L-4) 1/4                 Left Lower Extremity Quadriceps (L-2 to L-4) 1/4                              Plantars: Downgoing bilaterally Cerebellar: FTN without evidence of ataxia or dysmetria, HTN without evidence of ataxia Gait: Deferred  Disposition:   Discharge Instructions     Ambulatory referral to Occupational Therapy   Complete by: As directed    Ambulatory referral to Physical Therapy   Complete by: As directed       Allergies as of 09/15/2020       Reactions   Aspirin Other (See Comments)   Does not remember rxn        Medication  List     STOP taking these medications    acetaminophen 500 MG tablet Commonly known as: TYLENOL   dexamethasone 0.5 MG/5ML solution Commonly known as: DECADRON       TAKE these medications    aliskiren 150 MG tablet Commonly known as: Tekturna Take 1 tablet (150 mg total) by mouth every evening. Overdue for follow-up appt must have appt for future refills   aspirin 81 MG EC tablet Take 1 tablet (81 mg total) by mouth daily. Swallow whole. Start taking on: September 16, 2020   atorvastatin 40 MG tablet Commonly known as: LIPITOR Take 1 tablet (40 mg total) by mouth daily. Start taking on: September 16, 2020   cholecalciferol 25 MCG (1000 UNIT) tablet Commonly known as: VITAMIN D3 Take 1,000 Units by mouth daily.   fluticasone 50 MCG/ACT nasal spray Commonly known as: FLONASE Place 1 spray into both nostrils 2 (two) times daily as needed for allergies or rhinitis.   NON FORMULARY Take 50 mg by mouth daily as needed (pain). CBD oil capsule   PreviDent 5000 Booster Plus 1.1 % Pste Generic drug: Sodium Fluoride Take 1 application by mouth 2 (two) times daily. Dental paste   vitamin C 1000 MG tablet Take 1,000 mg by mouth daily.        Follow-up Information     [facility] Call.   Specialty: Rehabilitation Why: Outpatient physical and occupational therapy; please call ASAP for appointments. Contact information: [address] [id number] mc [place] [address] [phone]                Signed: Paulette Blanch 09/15/2020, 2:06 PM

## 2020-09-24 DIAGNOSIS — E789 Disorder of lipoprotein metabolism, unspecified: Secondary | ICD-10-CM | POA: Diagnosis not present

## 2020-09-24 DIAGNOSIS — I10 Essential (primary) hypertension: Secondary | ICD-10-CM | POA: Diagnosis not present

## 2020-09-24 DIAGNOSIS — Z09 Encounter for follow-up examination after completed treatment for conditions other than malignant neoplasm: Secondary | ICD-10-CM | POA: Diagnosis not present

## 2020-09-24 DIAGNOSIS — E559 Vitamin D deficiency, unspecified: Secondary | ICD-10-CM | POA: Diagnosis not present

## 2020-09-24 DIAGNOSIS — I639 Cerebral infarction, unspecified: Secondary | ICD-10-CM | POA: Diagnosis not present

## 2020-10-03 DIAGNOSIS — Z1339 Encounter for screening examination for other mental health and behavioral disorders: Secondary | ICD-10-CM | POA: Diagnosis not present

## 2020-10-03 DIAGNOSIS — N183 Chronic kidney disease, stage 3 unspecified: Secondary | ICD-10-CM | POA: Diagnosis not present

## 2020-10-03 DIAGNOSIS — Z131 Encounter for screening for diabetes mellitus: Secondary | ICD-10-CM | POA: Diagnosis not present

## 2020-10-03 DIAGNOSIS — E789 Disorder of lipoprotein metabolism, unspecified: Secondary | ICD-10-CM | POA: Diagnosis not present

## 2020-10-03 DIAGNOSIS — Z1159 Encounter for screening for other viral diseases: Secondary | ICD-10-CM | POA: Diagnosis not present

## 2020-10-03 DIAGNOSIS — R0602 Shortness of breath: Secondary | ICD-10-CM | POA: Diagnosis not present

## 2020-10-03 DIAGNOSIS — Z1331 Encounter for screening for depression: Secondary | ICD-10-CM | POA: Diagnosis not present

## 2020-10-03 DIAGNOSIS — Z113 Encounter for screening for infections with a predominantly sexual mode of transmission: Secondary | ICD-10-CM | POA: Diagnosis not present

## 2020-10-03 DIAGNOSIS — I10 Essential (primary) hypertension: Secondary | ICD-10-CM | POA: Diagnosis not present

## 2020-10-03 DIAGNOSIS — Z0000 Encounter for general adult medical examination without abnormal findings: Secondary | ICD-10-CM | POA: Diagnosis not present

## 2020-10-03 DIAGNOSIS — Z125 Encounter for screening for malignant neoplasm of prostate: Secondary | ICD-10-CM | POA: Diagnosis not present

## 2020-10-03 DIAGNOSIS — R5383 Other fatigue: Secondary | ICD-10-CM | POA: Diagnosis not present

## 2020-10-03 DIAGNOSIS — E559 Vitamin D deficiency, unspecified: Secondary | ICD-10-CM | POA: Diagnosis not present

## 2020-10-03 DIAGNOSIS — I639 Cerebral infarction, unspecified: Secondary | ICD-10-CM | POA: Diagnosis not present

## 2020-10-05 DIAGNOSIS — I10 Essential (primary) hypertension: Secondary | ICD-10-CM | POA: Diagnosis not present

## 2020-10-05 DIAGNOSIS — E789 Disorder of lipoprotein metabolism, unspecified: Secondary | ICD-10-CM | POA: Diagnosis not present

## 2020-10-10 DIAGNOSIS — E119 Type 2 diabetes mellitus without complications: Secondary | ICD-10-CM | POA: Diagnosis not present

## 2020-10-10 DIAGNOSIS — I10 Essential (primary) hypertension: Secondary | ICD-10-CM | POA: Diagnosis not present

## 2020-10-10 DIAGNOSIS — I639 Cerebral infarction, unspecified: Secondary | ICD-10-CM | POA: Diagnosis not present

## 2020-10-10 DIAGNOSIS — E789 Disorder of lipoprotein metabolism, unspecified: Secondary | ICD-10-CM | POA: Diagnosis not present

## 2020-10-11 ENCOUNTER — Ambulatory Visit: Payer: PPO | Admitting: Adult Health

## 2020-10-11 ENCOUNTER — Encounter: Payer: Self-pay | Admitting: Adult Health

## 2020-10-11 VITALS — BP 152/99 | HR 91 | Ht 70.0 in | Wt 213.0 lb

## 2020-10-11 DIAGNOSIS — I639 Cerebral infarction, unspecified: Secondary | ICD-10-CM | POA: Diagnosis not present

## 2020-10-11 DIAGNOSIS — I6381 Other cerebral infarction due to occlusion or stenosis of small artery: Secondary | ICD-10-CM

## 2020-10-11 NOTE — Patient Instructions (Addendum)
Continue aspirin 81 mg daily  and atorvastatin 40mg  daily  for secondary stroke prevention  Recommend considering Crestor for further stroke prevention if difficulty tolerating atorvastatin  Continue to follow up with PCP regarding cholesterol and blood pressure management  Maintain strict control of hypertension with blood pressure goal below 130/90 and cholesterol with LDL cholesterol (bad cholesterol) goal below 70 mg/dL.         Thank you for coming to see at [facility]. I hope we have been able to provide you high quality care today.  You may receive a patient satisfaction survey over the next few weeks. We would appreciate your feedback and comments so that we may continue to improve ourselves and the health of our patients.      Stroke Prevention Some medical conditions and lifestyle choices can lead to a higher risk for a stroke. You can help to prevent a stroke by eating healthy foods and exercising. It also helps to not smoke and to manage any health problems you may have. How can this condition affect me? A stroke is an emergency. It should be treated right away. A stroke can lead to brain damage or threaten your life. There is a better chance of surviving and getting better after a stroke if you get medical help right away. What can increase my risk? The following medical conditions may increase your risk of a stroke: Diseases of the heart and blood vessels (cardiovascular disease). High blood pressure (hypertension). Diabetes. High cholesterol. Sickle cell disease. Problems with blood clotting. Being very overweight. Sleeping problems (obstructivesleep apnea). Other risk factors include: Being older than age 41. A history of blood clots, stroke, or mini-stroke (TIA). Race, ethnic background, or a family history of stroke. Smoking or using tobacco products. Taking birth control pills, especially if you smoke. Heavy alcohol and drug use. Not  being active. What actions can I take to prevent this? Manage your health conditions High cholesterol. Eat a healthy diet. If this is not enough to manage your cholesterol, you may need to take medicines. Take medicines as told by your doctor. High blood pressure. Try to keep your blood pressure below 130/80. If your blood pressure cannot be managed through a healthy diet and regular exercise, you may need to take medicines. Take medicines as told by your doctor. Ask your doctor if you should check your blood pressure at home. Have your blood pressure checked every year. Diabetes. Eat a healthy diet and get regular exercise. If your blood sugar (glucose) cannot be managed through diet and exercise, you may need to take medicines. Take medicines as told by your doctor. Talk to your doctor about getting checked for sleeping problems. Signs of a problem can include: Snoring a lot. Feeling very tired. Make sure that you manage any other conditions you have. Nutrition  Follow instructions from your doctor about what to eat or drink. You may be told to: Eat and drink fewer calories each day. Limit how much salt (sodium) you use to 1,500 milligrams (mg) each day. Use only healthy fats for cooking, such as olive oil, canola oil, and sunflower oil. Eat healthy foods. To do this: Choose foods that are high in fiber. These include whole grains, and fresh fruits and vegetables. Eat at least 5 servings of fruits and vegetables a day. Try to fill one-half of your plate with fruits and vegetables at each meal. Choose low-fat (lean) proteins. These include low-fat cuts of meat, chicken without skin, fish, tofu,  beans, and nuts. Eat low-fat dairy products. Avoid foods that: Are high in salt. Have saturated fat. Have trans fat. Have cholesterol. Are processed or pre-made. Count how many carbohydrates you eat and drink each day. Lifestyle If you drink alcohol: Limit how much you have to: 0-1  drink a day for women who are not pregnant. 0-2 drinks a day for men. Know how much alcohol is in your drink. In the [place] one drink equals one 12 oz bottle of beer ( ), one 5 oz glass of wine ( ), or one 1 oz glass of hard liquor (35mL). Do not smoke or use any products that have nicotine or tobacco. If you need help quitting, ask your doctor. Avoid secondhand smoke. Do not use drugs. Activity  Try to stay at a healthy weight. Get at least 30 minutes of exercise on most days, such as: Fast walking. Biking. Swimming. Medicines Take over-the-counter and prescription medicines only as told by your doctor. Avoid taking birth control pills. Talk to your doctor about the risks of taking birth control pills if: You are over 45 years old. You smoke. You get very bad headaches. You have had a blood clot. Where to find more information American Stroke Association: www.strokeassociation.org Get help right away if: You or a loved one has any signs of a stroke. "BE FAST" is an easy way to remember the warning signs: B - Balance. Dizziness, sudden trouble walking, or loss of balance. E - Eyes. Trouble seeing or a change in how you see. F - Face. Sudden weakness or loss of feeling of the face. The face or eyelid may droop on one side. A - Arms. Weakness or loss of feeling in an arm. This happens all of a sudden and most often on one side of the body. S - Speech. Sudden trouble speaking, slurred speech, or trouble understanding what people say. T - Time. Time to call emergency services. Write down what time symptoms started. You or a loved one has other signs of a stroke, such as: A sudden, very bad headache with no known cause. Feeling like you may vomit (nausea). Vomiting. A seizure. These symptoms may be an emergency. Get help right away. Call your local emergency services (911 in the [place] Do not wait to see if the symptoms will go away. Do not drive yourself to the  hospital. Summary You can help to prevent a stroke by eating healthy, exercising, and not smoking. It also helps to manage any health problems you have. Do not smoke or use any products that contain nicotine or tobacco. Get help right away if you or a loved one has any signs of a stroke. This information is not intended to replace advice given to you by your health care provider. Make sure you discuss any questions you have with your health care provider. Document Revised: 08/22/2019 Document Reviewed: 08/22/2019 Elsevier Patient Education  2022 [employer].

## 2020-10-11 NOTE — Progress Notes (Signed)
[facility] [address] [place]. [place] [address] [phone]       HOSPITAL FOLLOW UP NOTE  [name] Date of Birth:  1950/08/15 Medical Record Number:  [id number]   Reason for Referral:  hospital stroke follow up    SUBJECTIVE:   CHIEF COMPLAINT:  Chief Complaint  Patient presents with   Follow-up    Rm 2 alone Pt is well and stable.     HPI:   Per Dr. Roda Shutters: 70 year old male with history of hypertension admitted 09/13/2020 for episode of altered feeling, speech difficulty, left-sided headache, vomiting x1 with high BP.  CT no acute abnormality.  Status post tPA.  CTA head and neck unremarkable.  Repeat CT showed slight increase of left thalamus density, concerning for tiny hemorrhage.  No tPA reversal needed.  Serial CT repeat overnight shows stable slight hyperdensity at the left thalamus. MRI showed left thalamic small infarct with small hemorrhagic conversion, chronic left BG/CR lacunar infarct. EF 55 to 60%, LE venous Doppler negative for DVT.  A1c 6.7, LDL 149, creatinine 1.02.  On exam, patient neurologically intact this morning.  NIH score 0.  Etiology for patient's symptoms likely due to small left thalamic infarct with petechial hemorrhagic transformation, small vessel source.  Put on aspirin 81 and Lipitor 40.  PT/OT recommend outpatient PT/OT.   Today, 10/11/2020, [name] is being seen for hospital follow up unaccompanied. Overall doing well. Recovered well from stroke standpoint without residual deficits. He has since returned back to work without difficulty. Denies new or reoccurring stroke/TIA symptoms.  Compliant on aspirin and atorvastatin.  Concerns regarding atorvastatin due to concern of mental fogginess. Multiple questions regarding cholesterol medications, secondary stroke prevention measures and requesting "statistical evidence" regarding recommendations.     PERTINENT IMAGING  CT HEAD   09/13/2020 (INITIAL)   IMPRESSION: There is no acute intracranial hemorrhage or evidence of acute infarction. ASPECT score is 10.   Chronic microvascular ischemic changes.  09/13/2020 (S/P TPA) IMPRESSION: Since the study 3-4 hours ago, there is a 6-7 mm region of slight hyperdensity in the left thalamus most consistent with petechial bleeding in a left thalamic infarction. No frank hematoma.   No other change. Chronic small-vessel ischemic changes throughout the cerebral hemispheric white matter.  09/14/2020 IMPRESSION: 1. Slight interval increase in size of subtle hyperdensity at the left thalamocapsular region, suggestive of a small petechial bleed in the setting of an underlying ischemic infarct. No significant mass effect. 2. No other new acute intracranial abnormality. 3. 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  09/14/2020 (6 HRS LATER) IMPRESSION: No significant interval change. Subtle hyperdensity left thalamus again noted. This appears to be present on the pre tPA CT of 09/13/2020 suggesting this may not be acute hemorrhage. MRI may be helpful.   CTA HEAD/NECK 09/13/2020 IMPRESSION: No occlusion or hemodynamically significant stenosis in the neck. No evidence of dissection.   No proximal intracranial vessel occlusion. 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   MR BRAIN 09/14/2020 IMPRESSION: Probably subacute left lateral thalamic infarction with a small focus of hemorrhage and mild surrounding edema. This does not appear to have progressed over the recent imaging.   Small chronic foci of hemosiderin deposition also noted in the right thalamus and at the genu of the internal capsule on the left.   Extensive chronic small-vessel ischemic changes throughout the cerebral hemispheric white matter elsewhere.       ROS:   14 system review of systems performed and negative  with exception of those listed in HPI  PMH:  Past Medical History:  Diagnosis Date    Hypertension    Pemphigoid, benign mucosal     PSH:  Past Surgical History:  Procedure Laterality Date   kidney removed     left nephrectomy     at age 81    Social History:  Social History   Socioeconomic History   Marital status: Married    Spouse name: Not on file   Number of children: Not on file   Years of education: Not on file   Highest education level: Not on file  Occupational History   Not on file  Tobacco Use   Smoking status: Never   Smokeless tobacco: Never  Substance and Sexual Activity   Alcohol use: Yes   Drug use: No   Sexual activity: Not on file  Other Topics Concern   Not on file  Social History Narrative   Not on file   Social Determinants of Health   Financial Resource Strain: Not on file  Food Insecurity: Not on file  Transportation Needs: Not on file  Physical Activity: Not on file  Stress: Not on file  Social Connections: Not on file  Intimate Partner Violence: Not on file    Family History:  Family History  Problem Relation Age of Onset   Stroke Father     Medications:   Current Outpatient Medications on File Prior to Visit  Medication Sig Dispense Refill   aliskiren (TEKTURNA) 150 MG tablet Take 1 tablet (150 mg total) by mouth every evening. Overdue for follow-up appt must have appt for future refills 30 tablet 0   Ascorbic Acid (VITAMIN C) 1000 MG tablet Take 1,000 mg by mouth daily.     aspirin EC 81 MG EC tablet Take 1 tablet (81 mg total) by mouth daily. Swallow whole. 30 tablet 11   atorvastatin (LIPITOR) 40 MG tablet Take 1 tablet (40 mg total) by mouth daily. 30 tablet 3   cholecalciferol (VITAMIN D3) 25 MCG (1000 UNIT) tablet Take 1,000 Units by mouth daily.     dexamethasone 0.5 mg/5 mL - diphenhydrAMINE 12.5 mg/5 mL oral solution 2:1 mixture Take by mouth as needed.     fluticasone (FLONASE) 50 MCG/ACT nasal spray Place 1 spray into both nostrils 2 (two) times daily as needed for allergies or rhinitis.     NON  FORMULARY Take 50 mg by mouth daily as needed (pain). CBD oil capsule     PREVIDENT 5000 BOOSTER PLUS 1.1 % PSTE Take 1 application by mouth 2 (two) times daily. Dental paste     No current facility-administered medications on file prior to visit.    Allergies:   Allergies  Allergen Reactions   Aspirin Other (See Comments)    Does not remember rxn      OBJECTIVE:  Physical Exam  Vitals:   10/11/20 1310  BP: (!) 152/99  Pulse: 91  Weight: 213 lb (96.6 kg)  Height: 5\' 10"  (1.778 m)   Body mass index is 30.56 kg/m. No results found.  General: well developed, well nourished, elderly Caucasian male, seated, in no evident distress Head: head normocephalic and atraumatic.   Neck: supple with no carotid or supraclavicular bruits Cardiovascular: regular rate and rhythm, no murmurs Musculoskeletal: no deformity Skin:  no rash/petichiae Vascular:  Normal pulses all extremities   Neurologic Exam Mental Status: Awake and fully alert.  Fluent speech and language.  Oriented to place and time. Recent and  remote memory intact. Attention span, concentration and fund of knowledge appropriate. Mood and affect appropriate.  Cranial Nerves: Fundoscopic exam reveals sharp disc margins. Pupils equal, briskly reactive to light. Extraocular movements full without nystagmus. Visual fields full to confrontation. Hearing intact. Facial sensation intact. Face, tongue, palate moves normally and symmetrically.  Motor: Normal bulk and tone. Normal strength in all tested extremity muscles Sensory.: intact to touch , pinprick , position and vibratory sensation.  Coordination: Rapid alternating movements normal in all extremities. Finger-to-nose and heel-to-shin performed accurately bilaterally. Gait and Station: Arises from chair without difficulty. Stance is normal. Gait demonstrates normal stride length and balance without use of assistive device.  Reflexes: 1+ and symmetric. Toes downgoing.      NIHSS  0 Modified Rankin  0      ASSESSMENT: Henry Fisher is a 70 y.o. year old male with recent left thalamic small infarct with small hemorrhagic conversion s/p tPA secondary to small vessel disease. Vascular risk factors include chronic left BG/CR infarct on imaging, HTN, HLD and DM.      PLAN:  Left thalamic infarct:  Recovered without residual deficit.   Continue aspirin 81 mg daily  and atorvastatin 40 mg daily for secondary stroke prevention.   Discussed secondary stroke prevention measures and importance of close PCP follow up for aggressive stroke risk factor management. I have gone over the pathophysiology of stroke, warning signs and symptoms, risk factors and their management in some detail with instructions to go to the closest emergency room for symptoms of concern. HTN: BP goal <130/90.  Stable on current regimen per PCP HLD: LDL goal <70. Recent LDL 149.  Continue atorvastatin 40 mg daily - he does have concerns of atorvastatin causing " mental fogginess".  Discussed possibly switching to Crestor.  He largely questioned use of statins and possible benefit -prolonged discussion regarding recommended statin use for overall secondary stroke prevention measures as well as elevated LDL level.  He wishes to hold off on switching statins at this time - defer to PCP DMII: A1c goal<7.0. Recent A1c 6.7.  Currently on nonpharmacological management monitor by PCP    No indication for routine follow-up with routine follow-up by Dr. Yetta Barre to continue to manage aggressive stroke risk factor management   CC:  [facility] provider: Dr. Pearlean Brownie PCP: Knox Royalty, MD    I spent a prolonged 59 minutes of face-to-face and non-face-to-face time with patient.  This included previsit chart review including review of recent hospitalization, lab review, study review, electronic health record documentation, patient education regarding recent stroke including etiology, secondary stroke  prevention measures and importance of managing stroke risk factors, indication for recommended stroke prevention measures from large amount of different research studies and attempted to answer multiple questions to the best of my ability and answered all other questions to patient satisfaction  Ihor Austin, [facility]  [facility] [address] [place], [place] [address]  Phone [phone] Fax [phone] Note: This document was prepared with digital dictation and possible smart phrase technology. Any transcriptional errors that result from this process are unintentional.

## 2020-12-24 ENCOUNTER — Other Ambulatory Visit: Payer: Self-pay

## 2020-12-24 NOTE — Patient Outreach (Signed)
[facility]) Care Management  12/24/2020  Mang Hazelrigg 1950-08-11 [id number]   Telephone outreach to patient to obtain mRS was successfully completed. MRS= 0   Vanice Sarah [facility] Care Management Assistant

## 2021-07-03 ENCOUNTER — Ambulatory Visit: Payer: PPO | Attending: Internal Medicine

## 2021-07-03 ENCOUNTER — Other Ambulatory Visit (HOSPITAL_BASED_OUTPATIENT_CLINIC_OR_DEPARTMENT_OTHER): Payer: Self-pay

## 2021-07-03 DIAGNOSIS — Z23 Encounter for immunization: Secondary | ICD-10-CM

## 2021-07-03 MED ORDER — PFIZER COVID-19 VAC BIVALENT 30 MCG/0.3ML IM SUSP
INTRAMUSCULAR | 0 refills | Status: AC
  Filled 2021-07-03: qty 0.3, 1d supply, fill #0

## 2021-07-03 NOTE — Progress Notes (Signed)
   Covid-19 Vaccination Clinic  Name:  Henry Fisher    MRN: [id number] DOB: 05-Oct-1950  07/03/2021  [name] was observed post Covid-19 immunization for 15 minutes without incident. He was provided with Vaccine Information Sheet and instruction to access the V-Safe system.   [name] was instructed to call 911 with any severe reactions post vaccine: Difficulty breathing  Swelling of face and throat  A fast heartbeat  A bad rash all over body  Dizziness and weakness   Immunizations Administered     Name Date Dose VIS Date Route   Pfizer Covid-19 Vaccine Bivalent Booster 07/03/2021  2:22 PM 0.3 mL 10/03/2020 Intramuscular   Manufacturer: [employer], [employer]   Lot: [id number]   NDC: [phone]

## 2022-02-28 IMAGING — CT CT HEAD W/O CM
4 series · 16 of 47 positions shown, 18 images · non-contrast
Comparison: CT head 09/13/2020

CLINICAL DATA: Stroke.  Post tPA administration.

EXAM:
CT HEAD WITHOUT CONTRAST
TECHNIQUE: Contiguous axial images were obtained from the base of the skull
through the vertex without intravenous contrast.

[Series 2: head without · axial · non-contrast · 0.46mm/px · z∈[+1034,+1174]mm · 7 of 38 slices shown, 9 images]
[im 5/38  brain]
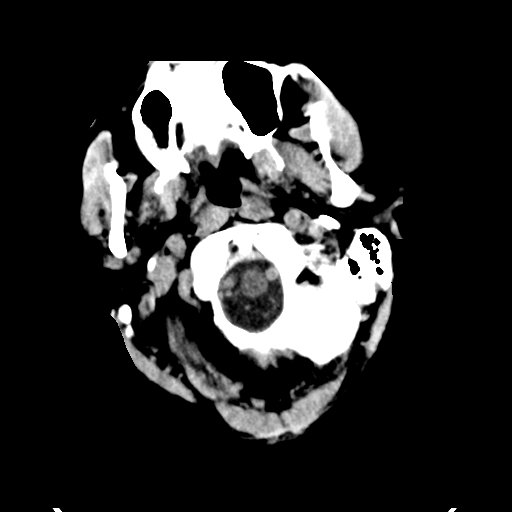
[im 5/38  bone]
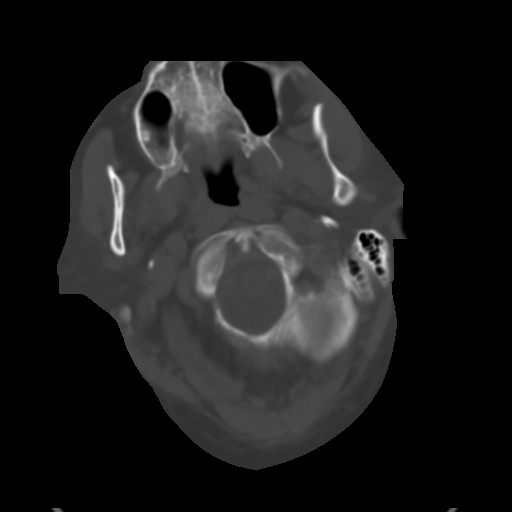
[im 10/38  brain]
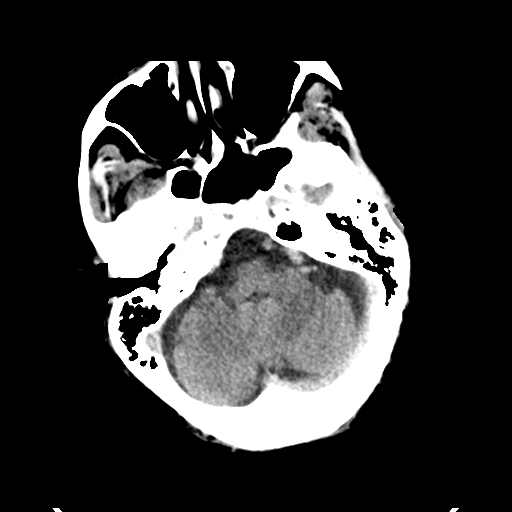
[im 14/38  brain]
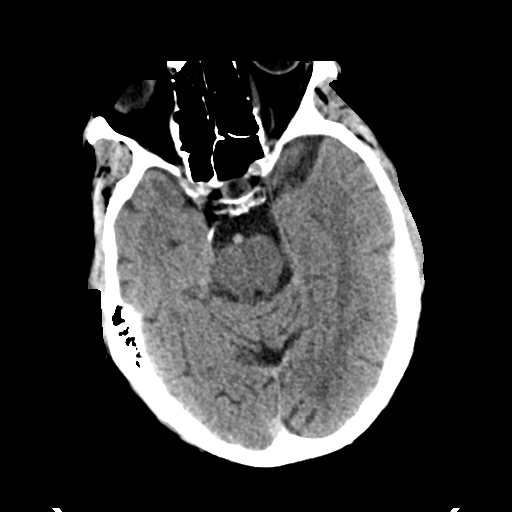
[im 19/38  brain]
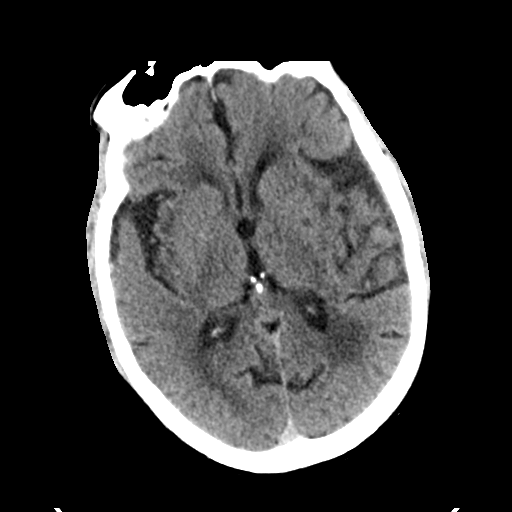
[im 24/38  brain]
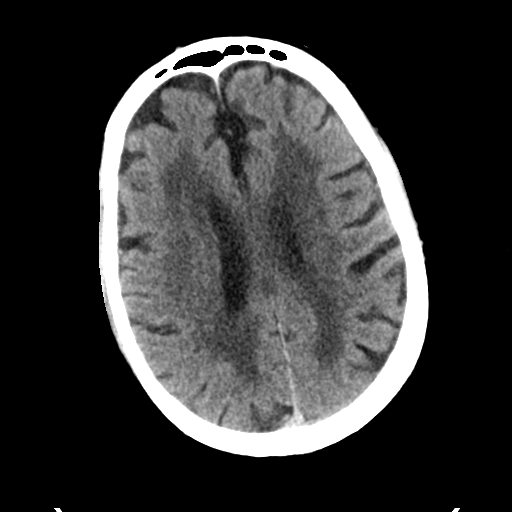
[im 24/38  bone]
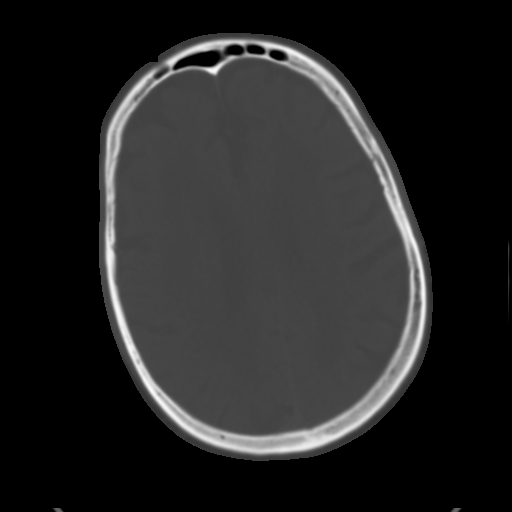
[im 28/38  brain]
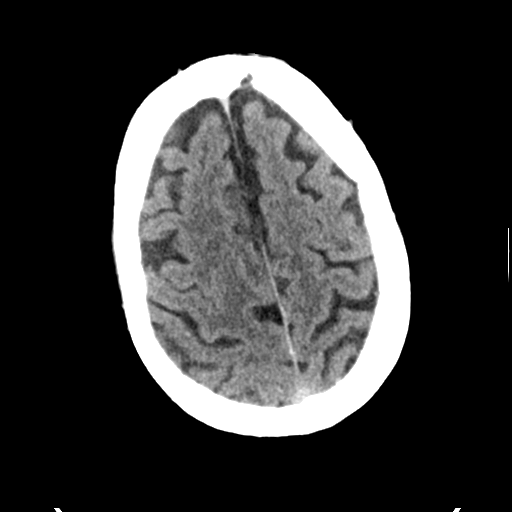
[im 33/38  brain]
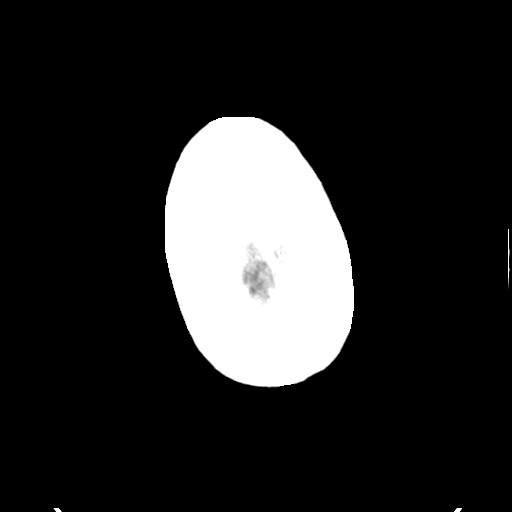

[Series 3: head bone · axial · 0.46mm/px · z∈[+1032,+1068]mm · 3 of 94 slices shown]
[im 10/94  bone]
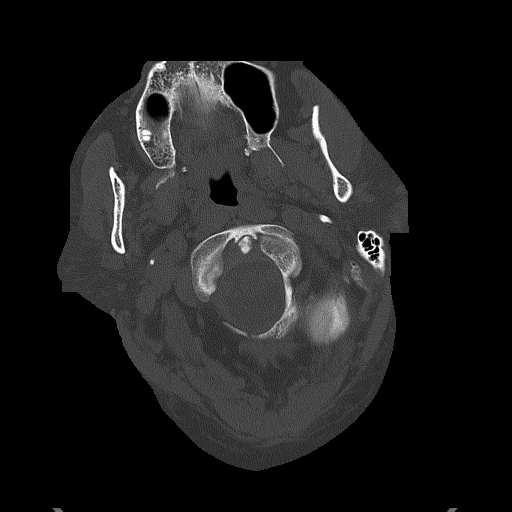
[im 19/94  bone]
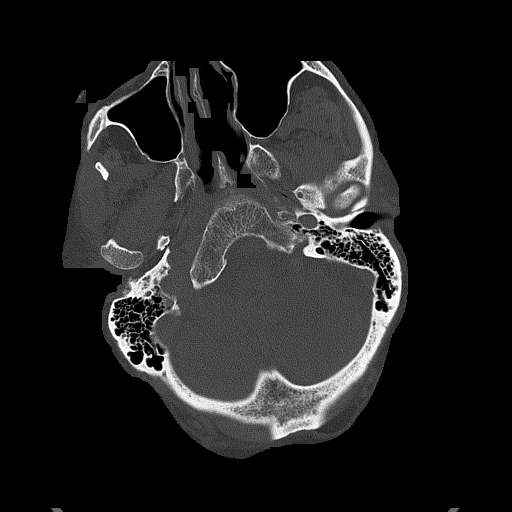
[im 28/94  bone]
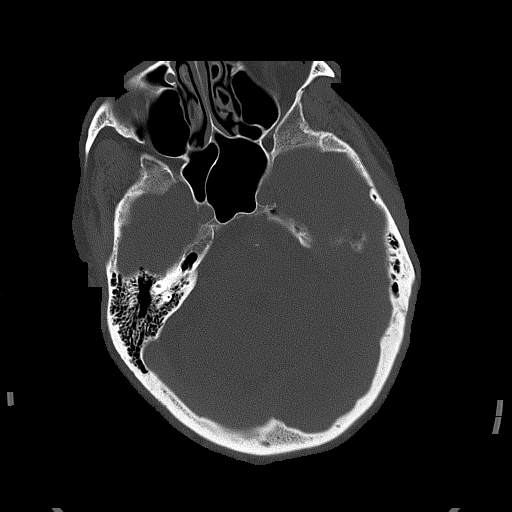

[Series 4: head without cor · coronal · non-contrast · 0.34mm/px · 3 of 74 slices shown]
[im 25/74  brain]
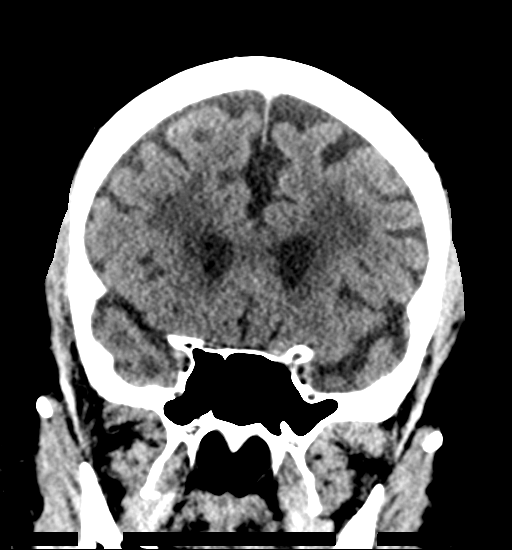
[im 33/74  brain]
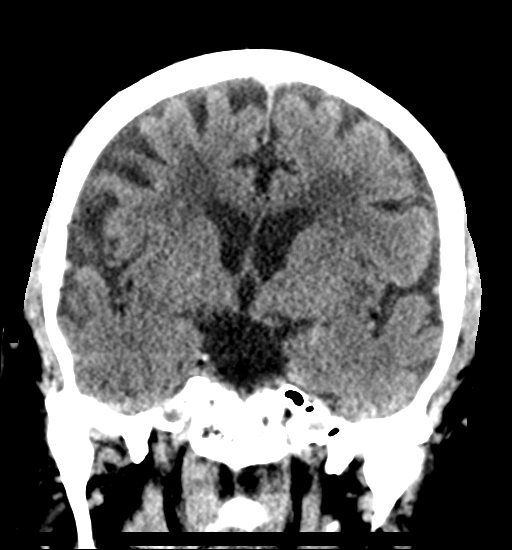
[im 41/74  brain]
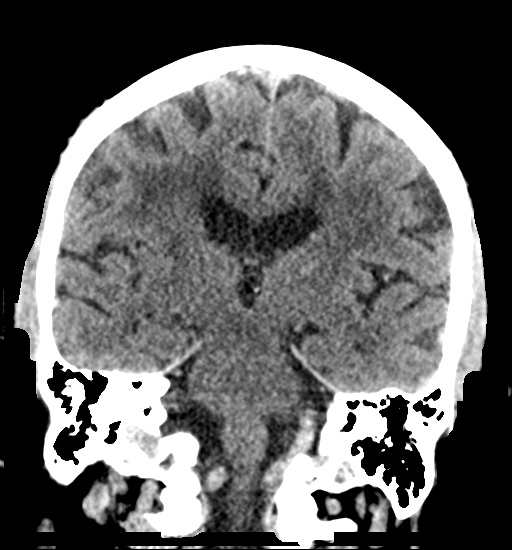

[Series 5: head without sag · sagittal · non-contrast · 0.36mm/px · 3 of 64 slices shown]
[im 22/64  brain]
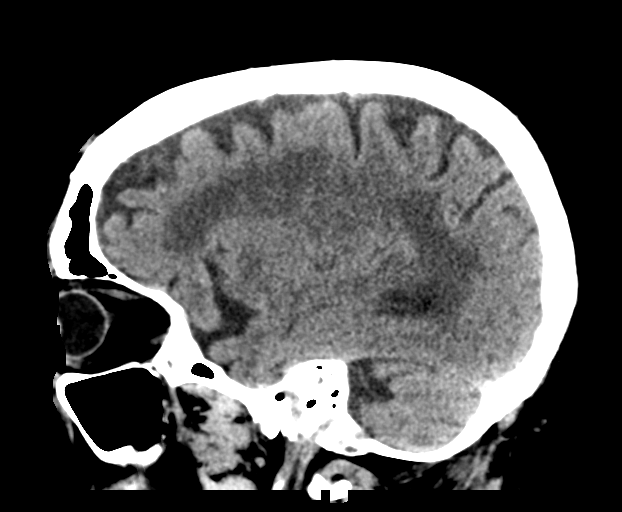
[im 32/64  brain]
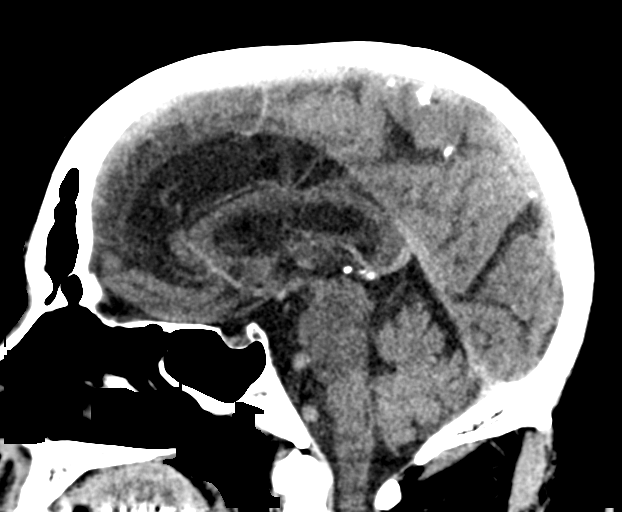
[im 43/64  brain]
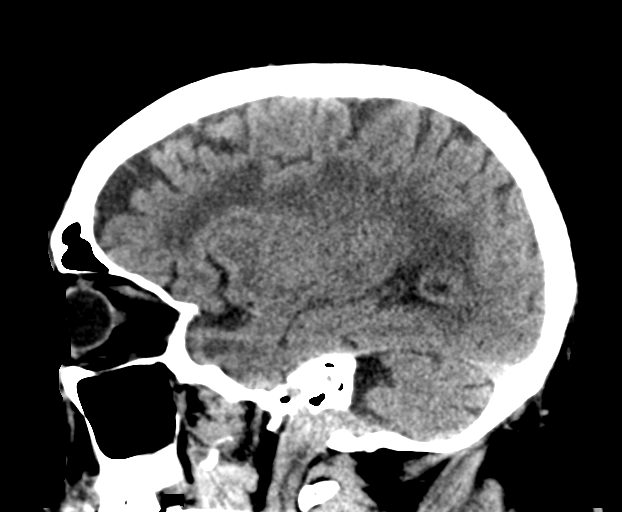

[16 of 47 positions shown; findings below may reference images not displayed]

FINDINGS: Brain: No change in slight hyperdensity left thalamus with
surrounding hypodensity. This is a subtle finding. In reviewing
prior studies, I believe this was present prior to tPA
administration. Given the very slight density above cortex, this
could be petechial acute hemorrhage or possibly chronic hemorrhage.
MRI may be helpful. Otherwise no new area of hemorrhage

Generalized atrophy. Extensive white matter hypodensity diffusely
unchanged. No new area of infarct, hemorrhage, mass.

Vascular: Negative for hyperdense vessel

Skull: Negative

Sinuses/Orbits: Minimal mucosal edema right maxillary sinus.
Remaining sinuses clear. Negative orbit

Other: None
IMPRESSION: No significant interval change. Subtle hyperdensity left thalamus
again noted. This appears to be present on the pre tPA CT of
09/13/2020 suggesting this may not be acute hemorrhage. MRI may be
helpful.

## 2022-05-21 DIAGNOSIS — E1165 Type 2 diabetes mellitus with hyperglycemia: Secondary | ICD-10-CM | POA: Diagnosis not present

## 2022-05-21 DIAGNOSIS — R4789 Other speech disturbances: Secondary | ICD-10-CM | POA: Diagnosis not present

## 2022-08-06 DIAGNOSIS — Z0000 Encounter for general adult medical examination without abnormal findings: Secondary | ICD-10-CM | POA: Diagnosis not present

## 2022-08-06 DIAGNOSIS — M79675 Pain in left toe(s): Secondary | ICD-10-CM | POA: Diagnosis not present

## 2022-08-29 DIAGNOSIS — E1165 Type 2 diabetes mellitus with hyperglycemia: Secondary | ICD-10-CM | POA: Diagnosis not present

## 2022-08-29 DIAGNOSIS — M79672 Pain in left foot: Secondary | ICD-10-CM | POA: Diagnosis not present

## 2022-09-01 ENCOUNTER — Other Ambulatory Visit: Payer: Self-pay | Admitting: Family Medicine

## 2022-09-01 ENCOUNTER — Ambulatory Visit
Admission: RE | Admit: 2022-09-01 | Discharge: 2022-09-01 | Disposition: A | Discharge status: Home / Self Care | Payer: PPO | Source: Ambulatory Visit | Attending: Family Medicine | Admitting: Family Medicine

## 2022-09-01 DIAGNOSIS — M79672 Pain in left foot: Secondary | ICD-10-CM

## 2022-12-04 ENCOUNTER — Encounter: Payer: Self-pay | Admitting: Adult Health

## 2022-12-08 DIAGNOSIS — L820 Inflamed seborrheic keratosis: Secondary | ICD-10-CM | POA: Diagnosis not present

## 2022-12-08 DIAGNOSIS — L578 Other skin changes due to chronic exposure to nonionizing radiation: Secondary | ICD-10-CM | POA: Diagnosis not present

## 2022-12-08 DIAGNOSIS — D1801 Hemangioma of skin and subcutaneous tissue: Secondary | ICD-10-CM | POA: Diagnosis not present

## 2022-12-08 DIAGNOSIS — D225 Melanocytic nevi of trunk: Secondary | ICD-10-CM | POA: Diagnosis not present

## 2022-12-08 DIAGNOSIS — L821 Other seborrheic keratosis: Secondary | ICD-10-CM | POA: Diagnosis not present

## 2022-12-08 DIAGNOSIS — D2371 Other benign neoplasm of skin of right lower limb, including hip: Secondary | ICD-10-CM | POA: Diagnosis not present

## 2022-12-08 DIAGNOSIS — D692 Other nonthrombocytopenic purpura: Secondary | ICD-10-CM | POA: Diagnosis not present

## 2023-08-31 DIAGNOSIS — E1165 Type 2 diabetes mellitus with hyperglycemia: Secondary | ICD-10-CM | POA: Diagnosis not present

## 2023-08-31 DIAGNOSIS — I10 Essential (primary) hypertension: Secondary | ICD-10-CM | POA: Diagnosis not present

## 2023-08-31 DIAGNOSIS — E7800 Pure hypercholesterolemia, unspecified: Secondary | ICD-10-CM | POA: Diagnosis not present

## 2023-09-07 DIAGNOSIS — E875 Hyperkalemia: Secondary | ICD-10-CM | POA: Diagnosis not present
